# Patient Record
Sex: Female | Born: 1990 | Race: Asian | Hispanic: No | Marital: Married | Smoking: Never smoker
Health system: Southern US, Community
[De-identification: ages and names within clinical notes are randomized; demographics above are authoritative.]

## PROBLEM LIST (undated history)

## (undated) DIAGNOSIS — E039 Hypothyroidism, unspecified: Secondary | ICD-10-CM

## (undated) DIAGNOSIS — D649 Anemia, unspecified: Secondary | ICD-10-CM

## (undated) DIAGNOSIS — Z319 Encounter for procreative management, unspecified: Secondary | ICD-10-CM

## (undated) HISTORY — PX: OTHER SURGICAL HISTORY: SHX169

---

## 2019-03-04 DIAGNOSIS — Z0000 Encounter for general adult medical examination without abnormal findings: Secondary | ICD-10-CM | POA: Diagnosis not present

## 2019-10-05 DIAGNOSIS — Z6820 Body mass index (BMI) 20.0-20.9, adult: Secondary | ICD-10-CM | POA: Diagnosis not present

## 2019-10-05 DIAGNOSIS — Z124 Encounter for screening for malignant neoplasm of cervix: Secondary | ICD-10-CM | POA: Diagnosis not present

## 2019-10-05 DIAGNOSIS — Z01419 Encounter for gynecological examination (general) (routine) without abnormal findings: Secondary | ICD-10-CM | POA: Diagnosis not present

## 2020-04-18 HISTORY — PX: MOUTH SURGERY: SHX715

## 2020-08-06 DIAGNOSIS — U071 COVID-19: Secondary | ICD-10-CM | POA: Diagnosis not present

## 2020-12-21 DIAGNOSIS — Z319 Encounter for procreative management, unspecified: Secondary | ICD-10-CM | POA: Diagnosis not present

## 2020-12-21 DIAGNOSIS — E669 Obesity, unspecified: Secondary | ICD-10-CM | POA: Diagnosis not present

## 2020-12-21 DIAGNOSIS — Z1389 Encounter for screening for other disorder: Secondary | ICD-10-CM | POA: Diagnosis not present

## 2020-12-21 DIAGNOSIS — Z130 Encounter for screening for diseases of the blood and blood-forming organs and certain disorders involving the immune mechanism: Secondary | ICD-10-CM | POA: Diagnosis not present

## 2020-12-21 DIAGNOSIS — Z01419 Encounter for gynecological examination (general) (routine) without abnormal findings: Secondary | ICD-10-CM | POA: Diagnosis not present

## 2021-03-17 DIAGNOSIS — Z711 Person with feared health complaint in whom no diagnosis is made: Secondary | ICD-10-CM | POA: Diagnosis not present

## 2021-03-17 DIAGNOSIS — Z3A01 Less than 8 weeks gestation of pregnancy: Secondary | ICD-10-CM | POA: Diagnosis not present

## 2021-03-17 DIAGNOSIS — O209 Hemorrhage in early pregnancy, unspecified: Secondary | ICD-10-CM | POA: Diagnosis not present

## 2021-03-17 DIAGNOSIS — R102 Pelvic and perineal pain: Secondary | ICD-10-CM | POA: Diagnosis not present

## 2021-03-19 ENCOUNTER — Other Ambulatory Visit: Payer: Self-pay

## 2021-03-19 ENCOUNTER — Inpatient Hospital Stay (HOSPITAL_COMMUNITY)
Admission: AD | Admit: 2021-03-19 | Discharge: 2021-03-19 | Disposition: A | Discharge status: Home / Self Care | Payer: 59 | Attending: Obstetrics and Gynecology | Admitting: Obstetrics and Gynecology

## 2021-03-19 ENCOUNTER — Inpatient Hospital Stay (HOSPITAL_COMMUNITY): Payer: 59

## 2021-03-19 ENCOUNTER — Encounter (HOSPITAL_COMMUNITY): Payer: Self-pay | Admitting: Obstetrics and Gynecology

## 2021-03-19 DIAGNOSIS — O039 Complete or unspecified spontaneous abortion without complication: Secondary | ICD-10-CM | POA: Insufficient documentation

## 2021-03-19 DIAGNOSIS — O209 Hemorrhage in early pregnancy, unspecified: Secondary | ICD-10-CM

## 2021-03-19 DIAGNOSIS — Z3A01 Less than 8 weeks gestation of pregnancy: Secondary | ICD-10-CM | POA: Insufficient documentation

## 2021-03-19 DIAGNOSIS — Z3A00 Weeks of gestation of pregnancy not specified: Secondary | ICD-10-CM | POA: Diagnosis not present

## 2021-03-19 HISTORY — DX: Anemia, unspecified: D64.9

## 2021-03-19 LAB — HCG, QUANTITATIVE, PREGNANCY: hCG, Beta Chain, Quant, S: 480 m[IU]/mL — ABNORMAL HIGH (ref <5)

## 2021-03-19 LAB — WET PREP, GENITAL
Clue Cells Wet Prep HPF POC: NONE SEEN
Sperm: NONE SEEN
Trich, Wet Prep: NONE SEEN
WBC, Wet Prep HPF POC: >=10 — AB (ref <10)
Yeast Wet Prep HPF POC: NONE SEEN

## 2021-03-19 LAB — CBC
HCT: 40.3 % (ref 36.0–46.0)
Hemoglobin: 14.1 g/dL (ref 12.0–15.0)
MCH: 30.9 pg (ref 26.0–34.0)
MCHC: 35 g/dL (ref 30.0–36.0)
MCV: 88.4 fL (ref 80.0–100.0)
Platelets: 206 10*3/uL (ref 150–400)
RBC: 4.56 MIL/uL (ref 3.87–5.11)
RDW: 11.7 % (ref 11.5–15.5)
WBC: 6.9 10*3/uL (ref 4.0–10.5)
nRBC: 0 % (ref 0.0–0.2)

## 2021-03-19 LAB — ABO/RH: ABO/RH(D): B POS

## 2021-03-19 NOTE — MAU Note (Signed)
Renee Jacobs is a 31 y.o. at [redacted]w[redacted]d here in MAU reporting: started spotting on Saturday and then the bleeding got a little heavier. [facility] and [facility] 1880 (pt has records with her). Has continued to bleed. Changing a pad 3-4 times per day. No pain.   LMP: 01/28/21  Onset of complaint: ongoing  Pain score: 0/10  Vitals:   03/19/21 0954  BP: 121/84  Pulse: 94  Resp: 16  Temp: 98 F (36.7 C)  SpO2: 95%     Lab orders placed from triage: none

## 2021-03-19 NOTE — Discharge Instructions (Signed)
[facility]   [facility]             [address], [place], [place] [address] [phone]  [facility]                                                             [address], [address], [place], [place], [address] [phone]  [facility]                                    [address], [address], [place], [place], [address] [phone]  [facility] [place] [address], [address], [place], [place], [address] [phone]  [facility]                                 [address], [place], [place], [address] [phone]  [facility]                                    [address], [place], [place], [address] [phone]  [facility] [address], [address], [place], [place], [address]                              [facility] [address], [address], [place], [place], [address] [phone]  [facility]         Phone: [phone]  [facility]      Phone: [phone]   [facility]      Phone: [phone]  [facility]         Phone: [phone]   [facility]    Phone: [phone]  [facility]      Phone: [phone]  [facility]      Phone: [phone]  [facility]        Phone: [phone]  [facility]      Phone: [phone]  

## 2021-03-19 NOTE — MAU Provider Note (Signed)
History     CSN: [id number]  Arrival date and time: 03/19/21 0940   None    Chief Complaint  Patient presents with   Vaginal Bleeding   HPI Renee Jacobs is a 31 y.o. G1P0 at [redacted]w[redacted]d by LMP who presents to MAU for vaginal bleeding. Patient reports she started spotting on Saturday morning and by the afternoon it had become heavier. [facility] was evaluated [facility]. She had labs, but never had an ultrasound. HCG was 1880 per patient. Yesterday she reports that the bleeding was heavier and she started passing small blood clots. Today bleeding is not as heavy and has only had to change her pad once. She denies pain, fever, or dizziness. LMP 01/28/2021. Has a pregnancy confirmation appointment on Wednesday at [facility].    OB History     Gravida  1   Para      Term      Preterm      AB      Living         SAB      IAB      Ectopic      Multiple      Live Births              Past Medical History:  Diagnosis Date   Anemia     Past Surgical History:  Procedure Laterality Date   MOUTH SURGERY  04/2020    Family History  Problem Relation Age of Onset   Healthy Mother    Hyperlipidemia Father    Hypertension Father     Social History   Tobacco Use   Smoking status: Never   Smokeless tobacco: Never  Vaping Use   Vaping Use: Never used  Substance Use Topics   Alcohol use: Not Currently   Drug use: Never    Allergies: No Known Allergies  Medications Prior to Admission  Medication Sig Dispense Refill Last Dose   Prenatal Vit-Fe Fumarate-FA (PRENATAL VITAMIN PO) Take by mouth.   Past Week    Review of Systems  Constitutional: Negative.   Respiratory: Negative.    Cardiovascular: Negative.   Gastrointestinal: Negative.   Genitourinary:  Positive for vaginal bleeding.  Neurological: Negative.    Physical Exam   Blood pressure 121/84, pulse 94, temperature 98 F (36.7 C), temperature source Oral, resp. rate 16, height 5'  4" (1.626 m), weight 50.9 kg, last menstrual period 01/28/2021, SpO2 95 %.  Physical Exam Constitutional:      General: She is not in acute distress. Eyes:     Extraocular Movements: Extraocular movements intact.     Pupils: Pupils are equal, round, and reactive to light.  Cardiovascular:     Rate and Rhythm: Normal rate.  Pulmonary:     Effort: Pulmonary effort is normal.  Abdominal:     General: Abdomen is flat.     Palpations: Abdomen is soft.     Tenderness: There is no abdominal tenderness.  Genitourinary:    Comments: NEFG, vaginal walls pink with rugae, small amount of dark red blood, cervix appears visually closed without lesions/masses Musculoskeletal:        General: Normal range of motion.     Cervical back: Normal range of motion.  Skin:    General: Skin is warm and dry.  Neurological:     General: No focal deficit present.     Mental Status: She is alert and oriented to person, place, and time.  Psychiatric:  Mood and Affect: Mood normal.        Behavior: Behavior normal.        Thought Content: Thought content normal.        Judgment: Judgment normal.   US OB LESS THAN 14 WEEKS WITH OB TRANSVAGINAL  Result Date: 03/19/2021 CLINICAL DATA:  Vaginal bleeding EXAM: OBSTETRIC <14 WK ULTRASOUND TECHNIQUE: Transabdominal ultrasound was performed for evaluation of the gestation as well as the maternal uterus and adnexal regions. COMPARISON:  None. FINDINGS: Intrauterine gestational sac: None Yolk sac:  Not Visualized. Embryo:  Not Visualized. Cardiac Activity: Not Visualized. Heart Rate: Not visualized. Maternal uterus/adnexae: Endometrium 4. Trace, nonspecific free fluid in the low pelvis. IMPRESSION: No candidate intrauterine gestation identified by ultrasound. Early pregnancy of unknown location. Recommend ectopic precautions, serial beta HCG, and consider follow-up imaging in 7-14 days. Electronically Signed   By: Delanna Ahmadi M.D.   On: 03/19/2021 11:32    MAU  Course  Procedures CBC, HCG, ABO/RH Wet prep, GC/CT Ultrasound   MDM Labs reassuring. There was a significant drop in HCG, >50% from HCG on Saturday, high suspicion of miscarriage. Ultrasound without evidence of IUP. Bleeding minimal. I offered chaplain services to patient and her husband, who decline.  Assessment and Plan  Miscarriage Vaginal bleeding affecting pregnancy  - Discharge home in stable condition. - Keep appointment as scheduled on Wednesday 03/21/2021 - Strict return and bleeding precautions reviewed. Return to MAU sooner or as needed for worsening symptoms    Renee Harder, CNM 03/19/2021, 12:24 PM

## 2021-03-20 LAB — GC/CHLAMYDIA PROBE AMP (~~LOC~~) NOT AT ARMC
Chlamydia: NEGATIVE
Comment: NEGATIVE
Comment: NORMAL
Neisseria Gonorrhea: NEGATIVE

## 2021-03-29 DIAGNOSIS — O039 Complete or unspecified spontaneous abortion without complication: Secondary | ICD-10-CM | POA: Diagnosis not present

## 2021-04-12 DIAGNOSIS — O021 Missed abortion: Secondary | ICD-10-CM | POA: Diagnosis not present

## 2021-07-04 ENCOUNTER — Encounter: Payer: Self-pay | Admitting: Obstetrics and Gynecology

## 2021-07-12 ENCOUNTER — Ambulatory Visit (INDEPENDENT_AMBULATORY_CARE_PROVIDER_SITE_OTHER): Payer: 59 | Admitting: Obstetrics and Gynecology

## 2021-07-12 ENCOUNTER — Encounter: Payer: Self-pay | Admitting: Obstetrics and Gynecology

## 2021-07-12 VITALS — BP 125/84 | HR 73 | Ht 64.0 in | Wt 114.0 lb

## 2021-07-12 DIAGNOSIS — Z8759 Personal history of other complications of pregnancy, childbirth and the puerperium: Secondary | ICD-10-CM

## 2021-07-12 DIAGNOSIS — R7989 Other specified abnormal findings of blood chemistry: Secondary | ICD-10-CM

## 2021-07-12 NOTE — Progress Notes (Signed)
   GYNECOLOGY OFFICE VISIT NOTE  History:   Renee Jacobs is a 31 y.o. G1P0010 here today for follow up from miscarriage and to establish care. She was previously going to [facility] but wanted to switch elsewhere.   She had a SAB in January of this year. She got pregnant after 2 months of trying. She was about [redacted] weeks pregnant at the time of the loss. She had some TSH testing following that and one was normal and was abnormal. This was done through [facility]. We do not yet have those records.   In March she got her next period which was normal for her and then they started to TTC. She has positive OPKs. Her cycles are regular.    She denies any abnormal vaginal discharge, bleeding, pelvic pain or other concerns.     Past Medical History:  Diagnosis Date   Anemia     Past Surgical History:  Procedure Laterality Date   MOUTH SURGERY  04/2020    The following portions of the patient's history were reviewed and updated as appropriate: allergies, current medications, past family history, past medical history, past social history, past surgical history and problem list.   Health Maintenance:   Per pt - normal pap smear in November 2022. We will obtain records from [facility].   Review of Systems:  Pertinent items noted in HPI and remainder of comprehensive ROS otherwise negative.  Physical Exam:  BP 125/84   Pulse 73   Ht 5\' 4"  (1.626 m)   Wt 114 lb (51.7 kg)   LMP 06/16/2021   BMI 19.57 kg/m  CONSTITUTIONAL: Well-developed, well-nourished female in no acute distress.  HEENT:  Normocephalic, atraumatic. External right and left ear normal. No scleral icterus.  NECK: Normal range of motion, supple, no masses noted on observation SKIN: No rash noted. Not diaphoretic. No erythema. No pallor. MUSCULOSKELETAL: Normal range of motion. No edema noted. NEUROLOGIC: Alert and oriented to person, place, and time. Normal muscle tone coordination. No cranial nerve  deficit noted. PSYCHIATRIC: Normal mood and affect. Normal behavior. Normal judgment and thought content.  CARDIOVASCULAR: Normal heart rate noted RESPIRATORY: Effort and breath sounds normal, no problems with respiration noted   PELVIC: Deferred  Labs and Imaging No results found for this or any previous visit (from the past 168 hour(s)). No results found.  Assessment and Plan:   1. Abnormal TSH - Will obtain results from prior testing and send to endo. Reviewed would suggest testing through them as they would be best at establishing if she has the diagnosis of subclinical hypothyroidism and if she would need therapy.  - Ambulatory referral to Endocrinology  2. History of miscarriage - Discussed common causes of SAB. Reassured her this was nothing in her control.  - Discussed plan for subsequent pregnancy i.e. serials betas if bleeding and/or early 06/18/2021 (typically around 7-8wks) - Answered all questions   Routine preventative health maintenance measures emphasized. Please refer to After Visit Summary for other counseling recommendations.   Return in about 6 months (around 01/12/2022) for annual.  01/14/2022, MD, FACOG Obstetrician & Gynecologist, [facility], [facility]

## 2021-07-18 ENCOUNTER — Encounter: Payer: Self-pay | Admitting: Obstetrics and Gynecology

## 2021-07-25 ENCOUNTER — Encounter: Payer: Self-pay | Admitting: *Deleted

## 2021-08-07 ENCOUNTER — Encounter: Payer: Self-pay | Admitting: Obstetrics and Gynecology

## 2021-09-04 DIAGNOSIS — E559 Vitamin D deficiency, unspecified: Secondary | ICD-10-CM | POA: Diagnosis not present

## 2021-09-04 DIAGNOSIS — E538 Deficiency of other specified B group vitamins: Secondary | ICD-10-CM | POA: Diagnosis not present

## 2021-09-04 DIAGNOSIS — Z0000 Encounter for general adult medical examination without abnormal findings: Secondary | ICD-10-CM | POA: Diagnosis not present

## 2021-09-04 DIAGNOSIS — Z79899 Other long term (current) drug therapy: Secondary | ICD-10-CM | POA: Diagnosis not present

## 2021-09-11 DIAGNOSIS — Z124 Encounter for screening for malignant neoplasm of cervix: Secondary | ICD-10-CM | POA: Diagnosis not present

## 2021-09-11 DIAGNOSIS — Z131 Encounter for screening for diabetes mellitus: Secondary | ICD-10-CM | POA: Diagnosis not present

## 2021-09-11 DIAGNOSIS — Z13220 Encounter for screening for lipoid disorders: Secondary | ICD-10-CM | POA: Diagnosis not present

## 2021-09-11 DIAGNOSIS — E559 Vitamin D deficiency, unspecified: Secondary | ICD-10-CM | POA: Diagnosis not present

## 2021-11-09 ENCOUNTER — Encounter: Payer: Self-pay | Admitting: Obstetrics and Gynecology

## 2021-11-13 ENCOUNTER — Other Ambulatory Visit: Payer: Self-pay | Admitting: *Deleted

## 2022-01-01 ENCOUNTER — Encounter: Payer: Self-pay | Admitting: *Deleted

## 2022-01-01 NOTE — Progress Notes (Signed)
   ANNUAL EXAM Patient name: Treva Nase MRN [id number]  Date of birth: [birth year] Chief Complaint:   No chief complaint on file.  History of Present Illness:   Rozelia Schlosser is a 31 y.o. G49P0010 female being seen today for a routine annual exam.   Current complaints: None  Patient's last menstrual period was 12/02/2021 (approximate).  Current birth control: None, plans to TTC. Seeing [facility] next week.   Last pap: 2021 per documentation in records under media tab.  Results were: NILM w/ HRHPV negative. H/O abnormal pap: no  Health Maintenance Due  Topic Date Due   COVID-19 Vaccine (1) Never done   HIV Screening  Never done   Hepatitis C Screening  Never done   TETANUS/TDAP  Never done   INFLUENZA VACCINE  Never done    Review of Systems:   Pertinent items are noted in HPI Denies any headaches, blurred vision, fatigue, shortness of breath, chest pain, abdominal pain, abnormal vaginal discharge/itching/odor/irritation, problems with periods, bowel movements, urination, or intercourse unless otherwise stated above.  Pertinent History Reviewed:  Reviewed past medical,surgical, social and family history.  Reviewed problem list, medications and allergies. Physical Assessment:   Vitals:   01/02/22 0813  BP: 111/74  Pulse: 68  Weight: 117 lb (53.1 kg)  Height: 5\' 4"  (1.626 m)  Body mass index is 20.08 kg/m.   Physical Examination:  General appearance - well appearing, and in no distress Mental status - alert, oriented to person, place, and time Psych:  She has a normal mood and affect Skin - warm and dry, normal color, no suspicious lesions noted Chest - effort normal Heart - normal rate  Breasts - breasts appear normal, no suspicious masses, no skin or nipple changes or axillary nodes Abdomen - soft, nontender, nondistended, no masses or organomegaly Pelvic -  VULVA: normal appearing vulva with no masses, tenderness or lesions  VAGINA: normal appearing vagina with normal  color and discharge, no lesions  CERVIX: normal appearing cervix without discharge or lesions, no CMT UTERUS: uterus is felt to be normal size, shape, consistency and nontender  ADNEXA: No adnexal masses or tenderness noted. Extremities:  No swelling or varicosities noted  Chaperone present for exam  No results found for this or any previous visit (from the past 24 hour(s)).  Assessment & Plan:  Diagnoses and all orders for this visit:  Encounter for annual routine gynecological examination  - Cervical cancer screening: Discussed guidelines. Pap with HPV wnl 09/2019. Due no sooner than 2024.  - GC/CT: declines - Birth Control:  Declines, plans to TTC - Breast Health: Encouraged self breast awareness/SBE. Teaching provided.  - F/U 12 months and prn   No orders of the defined types were placed in this encounter.   Meds: No orders of the defined types were placed in this encounter.   Follow-up: No follow-ups on file.  [name], MD 01/02/2022 9:06 AM

## 2022-01-02 ENCOUNTER — Ambulatory Visit: Payer: 59 | Admitting: Obstetrics and Gynecology

## 2022-01-02 ENCOUNTER — Encounter: Payer: Self-pay | Admitting: Obstetrics and Gynecology

## 2022-01-02 ENCOUNTER — Ambulatory Visit (INDEPENDENT_AMBULATORY_CARE_PROVIDER_SITE_OTHER): Payer: 59 | Admitting: Obstetrics and Gynecology

## 2022-01-02 VITALS — BP 111/74 | HR 68 | Ht 64.0 in | Wt 117.0 lb

## 2022-01-02 DIAGNOSIS — Z01419 Encounter for gynecological examination (general) (routine) without abnormal findings: Secondary | ICD-10-CM | POA: Diagnosis not present

## 2022-01-08 DIAGNOSIS — Z3169 Encounter for other general counseling and advice on procreation: Secondary | ICD-10-CM | POA: Diagnosis not present

## 2022-01-08 DIAGNOSIS — Z1321 Encounter for screening for nutritional disorder: Secondary | ICD-10-CM | POA: Diagnosis not present

## 2022-01-08 DIAGNOSIS — Z3141 Encounter for fertility testing: Secondary | ICD-10-CM | POA: Diagnosis not present

## 2022-01-08 DIAGNOSIS — Z1371 Encounter for nonprocreative screening for genetic disease carrier status: Secondary | ICD-10-CM | POA: Diagnosis not present

## 2022-01-08 DIAGNOSIS — Z0184 Encounter for antibody response examination: Secondary | ICD-10-CM | POA: Diagnosis not present

## 2022-02-08 DIAGNOSIS — Z1371 Encounter for nonprocreative screening for genetic disease carrier status: Secondary | ICD-10-CM | POA: Diagnosis not present

## 2022-02-08 DIAGNOSIS — Z3169 Encounter for other general counseling and advice on procreation: Secondary | ICD-10-CM | POA: Diagnosis not present

## 2022-02-08 DIAGNOSIS — Z3141 Encounter for fertility testing: Secondary | ICD-10-CM | POA: Diagnosis not present

## 2022-02-08 DIAGNOSIS — Z0184 Encounter for antibody response examination: Secondary | ICD-10-CM | POA: Diagnosis not present

## 2022-02-08 DIAGNOSIS — Z1321 Encounter for screening for nutritional disorder: Secondary | ICD-10-CM | POA: Diagnosis not present

## 2022-02-09 DIAGNOSIS — Z3169 Encounter for other general counseling and advice on procreation: Secondary | ICD-10-CM | POA: Diagnosis not present

## 2022-02-09 DIAGNOSIS — Z31430 Encounter of female for testing for genetic disease carrier status for procreative management: Secondary | ICD-10-CM | POA: Diagnosis not present

## 2022-02-09 DIAGNOSIS — Z1371 Encounter for nonprocreative screening for genetic disease carrier status: Secondary | ICD-10-CM | POA: Diagnosis not present

## 2022-03-01 ENCOUNTER — Ambulatory Visit: Payer: 59 | Admitting: Internal Medicine

## 2022-03-08 DIAGNOSIS — Z0184 Encounter for antibody response examination: Secondary | ICD-10-CM | POA: Diagnosis not present

## 2022-03-08 DIAGNOSIS — Z3141 Encounter for fertility testing: Secondary | ICD-10-CM | POA: Diagnosis not present

## 2022-03-08 DIAGNOSIS — R7989 Other specified abnormal findings of blood chemistry: Secondary | ICD-10-CM | POA: Diagnosis not present

## 2022-04-15 DIAGNOSIS — E288 Other ovarian dysfunction: Secondary | ICD-10-CM | POA: Diagnosis not present

## 2022-05-15 DIAGNOSIS — Z113 Encounter for screening for infections with a predominantly sexual mode of transmission: Secondary | ICD-10-CM | POA: Diagnosis not present

## 2022-05-15 DIAGNOSIS — Z3141 Encounter for fertility testing: Secondary | ICD-10-CM | POA: Diagnosis not present

## 2022-06-13 DIAGNOSIS — Z3141 Encounter for fertility testing: Secondary | ICD-10-CM | POA: Diagnosis not present

## 2022-09-02 ENCOUNTER — Other Ambulatory Visit: Payer: Self-pay

## 2022-09-02 DIAGNOSIS — E039 Hypothyroidism, unspecified: Secondary | ICD-10-CM

## 2022-09-26 ENCOUNTER — Encounter: Payer: Self-pay | Admitting: "Endocrinology

## 2022-09-26 ENCOUNTER — Other Ambulatory Visit (INDEPENDENT_AMBULATORY_CARE_PROVIDER_SITE_OTHER): Payer: BC Managed Care – PPO

## 2022-09-26 DIAGNOSIS — E039 Hypothyroidism, unspecified: Secondary | ICD-10-CM | POA: Diagnosis not present

## 2022-09-26 LAB — T4, FREE: Free T4: 0.9 ng/dL (ref 0.60–1.60)

## 2022-09-26 LAB — TSH: TSH: 3.84 u[IU]/mL (ref 0.35–5.50)

## 2022-09-27 ENCOUNTER — Other Ambulatory Visit: Payer: Self-pay | Admitting: "Endocrinology

## 2022-09-27 MED ORDER — LEVOTHYROXINE SODIUM 50 MCG PO TABS: 50.0000 ug | ORAL_TABLET | Freq: Every day | ORAL | 0 refills | Status: DC

## 2022-10-10 ENCOUNTER — Ambulatory Visit (INDEPENDENT_AMBULATORY_CARE_PROVIDER_SITE_OTHER): Payer: BC Managed Care – PPO | Admitting: "Endocrinology

## 2022-10-10 ENCOUNTER — Encounter: Payer: Self-pay | Admitting: "Endocrinology

## 2022-10-10 VITALS — BP 100/80 | HR 73 | Ht 64.0 in | Wt 119.8 lb

## 2022-10-10 DIAGNOSIS — E038 Other specified hypothyroidism: Secondary | ICD-10-CM | POA: Diagnosis not present

## 2022-10-10 NOTE — Progress Notes (Signed)
Outpatient Endocrinology Note Renee Allegan, MD  10/10/22   Renee Jacobs 05-18-1990 [id number]  Referring Provider: Fermin Schwab, MD Primary Care Provider: Pcp, No Subjective  No chief complaint on file.   Assessment & Plan  Diagnoses and all orders for this visit:  Subclinical hypothyroidism -     TSH; Future -     T4, free; Future    Renee Jacobs is currently taking levothyroxine 50 mcg every day since 09/27/22 started by me to help assist with pregnancy. Patient is undergoing fertility treatment.  Patient is currently biochemically euthyroid, with levothyroxine.  Highest TSH off of levothyroxine per pt was in 6.   Educated on thyroid axis.  Recommend the following: Take levothyroxine 50 mcg every morning.  Repeat labs today. Advised to take levothyroxine first thing in the morning on empty stomach and wait at least 30 minutes to 1 hour before eating or drinking anything or taking any other medications. Space out levothyroxine by 4 hours from any acid reflux medication/fibrate/iron/calcium/multivitamin. Advised to take birth control pills and nutritional supplements in the evening. Repeat lab before next visit or sooner if symptoms of hyperthyroidism or hypothyroidism develop.  Notify us immediately in case of pregnancy/breastfeeding or significant weight gain or loss. Counseled on compliance and follow up needs.  I have reviewed current medications, nurse's notes, allergies, vital signs, past medical and surgical history, family medical history, and social history for this encounter. Counseled patient on symptoms, examination findings, lab findings, imaging results, treatment decisions and monitoring and prognosis. The patient understood the recommendations and agrees with the treatment plan. All questions regarding treatment plan were fully answered.   Return in about 6 weeks (around 11/21/2022) for telemedicine at 3:20 pm, labs before next visit, labs today  .   Renee [place], MD  10/10/22   I have reviewed current medications, nurse's notes, allergies, vital signs, past medical and surgical history, family medical history, and social history for this encounter. Counseled patient on symptoms, examination findings, lab findings, imaging results, treatment decisions and monitoring and prognosis. The patient understood the recommendations and agrees with the treatment plan. All questions regarding treatment plan were fully answered.   History of Present Illness Renee Jacobs is a 32 y.o. year old female who presents to our clinic with subclinical hypothyroidism diagnosed in 2023.    Highest TSH off of levothyroxine per pt was in 6.   Started attempts on conceiving in Nov 2022 Has a miscarriage at 6 weeks in 02/2021 On fertility treatment in 01/2022 with [facility] in [place]  She is on IVF treatment since 05/2022 Currently pregnant per pt   Symptoms suggestive of HYPOTHYROIDISM:  fatigue Yes, improved  weight gain No cold intolerance  No constipation  No  Compressive symptoms:  dysphagia  No dysphonia  No positional dyspnea (especially with simultaneous arms elevation)  No  Smokes  No On biotin  No Personal history of head/neck surgery/irradiation  No  Physical Exam  BP 100/80   Pulse 73   Ht 5\' 4"  (1.626 m)   Wt 119 lb 12.8 oz (54.3 kg)   SpO2 99%   BMI 20.56 kg/m  Constitutional: well developed, well nourished Head: normocephalic, atraumatic, no exophthalmos Eyes: sclera anicteric, no redness Neck: no thyromegaly, no thyroid tenderness; no nodules palpated Lungs: normal respiratory effort Neurology: alert and oriented, no fine hand tremor Skin: dry, no appreciable rashes Musculoskeletal: no appreciable defects Psychiatric: normal mood and affect  Allergies No Known Allergies  Current Medications Patient's  Medications  New Prescriptions   No medications on file  Previous Medications   ASPIRIN 81  MG CHEWABLE TABLET    Chew 81 mg by mouth daily.   ESTRADIOL (ESTRACE) 2 MG TABLET    Take 2 mg by mouth daily.   LEVOTHYROXINE (SYNTHROID) 50 MCG TABLET    Take 1 tablet (50 mcg total) by mouth daily.   OMEGA-3 FATTY ACIDS (FISH OIL) 1000 MG CAPS    Take 1,000 mg by mouth daily.   PRENATAL VIT-FE FUMARATE-FA (PRENATAL VITAMIN PO)    Take by mouth.   PROGESTERONE MICRONIZED PO    Take 1 mL by mouth daily at 8 pm.  Modified Medications   No medications on file  Discontinued Medications   No medications on file    Past Medical History Past Medical History:  Diagnosis Date   Anemia     Past Surgical History Past Surgical History:  Procedure Laterality Date   MOUTH SURGERY  04/2020    Family History family history includes Healthy in her mother; Hyperlipidemia in her father; Hypertension in her father.  Social History Social History   Socioeconomic History   Marital status: Married    Spouse name: Not on file   Number of children: Not on file   Years of education: Not on file   Highest education level: Not on file  Occupational History   Not on file  Tobacco Use   Smoking status: Never   Smokeless tobacco: Never  Vaping Use   Vaping status: Never Used  Substance and Sexual Activity   Alcohol use: Not Currently   Drug use: Never   Sexual activity: Yes  Other Topics Concern   Not on file  Social History Narrative   Not on file   Social Determinants of Health   Financial Resource Strain: Not on file  Food Insecurity: Not on file  Transportation Needs: Not on file  Physical Activity: Not on file  Stress: Not on file  Social Connections: Not on file  Intimate Partner Violence: Not on file    Laboratory Investigations Lab Results  Component Value Date   TSH 3.84 09/26/2022   FREET4 0.90 09/26/2022     No results found for: "TSI"   No components found for: "TRAB"   No results found for: "CHOL" No results found for: "HDL" No results found for:  "LDLCALC" No results found for: "TRIG" No results found for: "CHOLHDL" No results found for: "CREATININE" No results found for: "GFR" No results found for: "NA", "K", "CL", "CO2", "GLUCOSE", "BUN", "CREATININE", "CALCIUM", "PROT", "ALBUMIN", "AST", "ALT", "ALKPHOS", "BILITOT", "GFRNONAA", "GFRAA"     No data to display             Component Value Date/Time   WBC 6.9 03/19/2021 1056   RBC 4.56 03/19/2021 1056   HGB 14.1 03/19/2021 1056   HCT 40.3 03/19/2021 1056   PLT 206 03/19/2021 1056   MCV 88.4 03/19/2021 1056   MCH 30.9 03/19/2021 1056   MCHC 35.0 03/19/2021 1056   RDW 11.7 03/19/2021 1056      Parts of this note may have been dictated using voice recognition software. There may be variances in spelling and vocabulary which are unintentional. Not all errors are proofread. Please notify the There[facility] Parkin if any discrepancies are noted or if the meaning of any statement is not clear.

## 2022-10-11 LAB — T4, FREE: Free T4: 1.26 ng/dL (ref 0.60–1.60)

## 2022-10-11 LAB — TSH: TSH: 2.58 u[IU]/mL (ref 0.35–5.50)

## 2022-10-14 ENCOUNTER — Telehealth: Payer: Self-pay | Admitting: "Endocrinology

## 2022-10-14 ENCOUNTER — Other Ambulatory Visit: Payer: Self-pay | Admitting: "Endocrinology

## 2022-10-14 DIAGNOSIS — E038 Other specified hypothyroidism: Secondary | ICD-10-CM

## 2022-10-14 NOTE — Telephone Encounter (Signed)
Patient asking to have lab orders sent to [facility] prior to her next visit. Sept 22 nd, please let patient know when done

## 2022-10-16 NOTE — Telephone Encounter (Signed)
Order has been faxed to [facility] [phone] (fax)

## 2022-10-24 ENCOUNTER — Other Ambulatory Visit: Payer: Self-pay | Admitting: "Endocrinology

## 2022-11-08 ENCOUNTER — Encounter: Payer: Self-pay | Admitting: *Deleted

## 2022-11-12 ENCOUNTER — Other Ambulatory Visit: Payer: Self-pay | Admitting: "Endocrinology

## 2022-11-12 DIAGNOSIS — E038 Other specified hypothyroidism: Secondary | ICD-10-CM | POA: Diagnosis not present

## 2022-11-13 LAB — T4F: T4,Free (Direct): 1.97 ng/dL — ABNORMAL HIGH (ref 0.82–1.77)

## 2022-11-13 LAB — TSH RFX ON ABNORMAL TO FREE T4: TSH: 0.261 u[IU]/mL — ABNORMAL LOW (ref 0.450–4.500)

## 2022-11-21 ENCOUNTER — Telehealth (INDEPENDENT_AMBULATORY_CARE_PROVIDER_SITE_OTHER): Payer: BC Managed Care – PPO | Admitting: "Endocrinology

## 2022-11-21 ENCOUNTER — Encounter: Payer: Self-pay | Admitting: "Endocrinology

## 2022-11-21 DIAGNOSIS — E038 Other specified hypothyroidism: Secondary | ICD-10-CM

## 2022-11-21 DIAGNOSIS — Z3A09 9 weeks gestation of pregnancy: Secondary | ICD-10-CM | POA: Diagnosis not present

## 2022-11-21 DIAGNOSIS — O210 Mild hyperemesis gravidarum: Secondary | ICD-10-CM | POA: Diagnosis not present

## 2022-11-21 NOTE — Progress Notes (Addendum)
The patient reports they are currently: Renee Jacobs. I spent 14-15 minutes on the video with the patient on the date of service. I spent an additional 10 minutes on pre- and post-visit activities on the date of service.   The patient was physically located in [place] or a state in which I am permitted to provide care. The patient and/or parent/guardian understood that s/he may incur co-pays and cost sharing, and agreed to the telemedicine visit. The visit was reasonable and appropriate under the circumstances given the patient's presentation at the time.  The patient and/or parent/guardian has been advised of the potential risks and limitations of this mode of treatment (including, but not limited to, the absence of in-person examination) and has agreed to be treated using telemedicine. The patient's/patient's family's questions regarding telemedicine have been answered.   The patient and/or parent/guardian has also been advised to contact their provider's office for worsening conditions, and seek emergency medical treatment and/or call 911 if the patient deems either necessary.     Outpatient Endocrinology Note Altamese Harpster, MD  11/21/22   Renee Jacobs 1990/08/13 [id number]  Referring Provider: No ref. provider found Primary Care Provider: Pcp, No Subjective  No chief complaint on file.   Assessment & Plan  Diagnoses and all orders for this visit:  Subclinical hypothyroidism -     T4, free; Future -     TSH; Future  Morning sickness   Noraa Pickeral is currently taking levothyroxine 50 mcg every day (2 pills on Sunday). Start on 09/27/22 started by me to help assist with ongoing IVF induced pregnancy.   Patient is currently biochemically euthyroid, with levothyroxine. Highest TSH off of levothyroxine per pt was in 6.   Educated on thyroid axis.  Recommend the following: Take one pill of levothyroxine 50 mcg every morning (stop extra pill on Sunday).  Repeat labs before next visit.   Advised to take levothyroxine first thing in the morning on empty stomach and wait at least 30 minutes to 1 hour before eating or drinking anything or taking any other medications. Space out levothyroxine by 4 hours from any acid reflux medication/fibrate/iron/calcium/multivitamin. Advised to take birth control pills and nutritional supplements in the evening. Repeat lab before next visit or sooner if symptoms of hyperthyroidism or hypothyroidism develop.  Notify us immediately in case of pregnancy/breastfeeding or significant weight gain or loss. Counseled on compliance and follow up needs.  Discussed extensively ways to cope up with morning sickness, improve oral intake and healthy weight gain during pregnancy   I have reviewed current medications, nurse's notes, allergies, vital signs, past medical and surgical history, family medical history, and social history for this encounter. Counseled patient on symptoms, examination findings, lab findings, imaging results, treatment decisions and monitoring and prognosis. The patient understood the recommendations and agrees with the treatment plan. All questions regarding treatment plan were fully answered.   Return in about 6 weeks (around 01/02/2023) for tele-visit: 3:20 pm, lab before next visit.   Altamese Silverdale, MD  11/21/22   I have reviewed current medications, nurse's notes, allergies, vital signs, past medical and surgical history, family medical history, and social history for this encounter. Counseled patient on symptoms, examination findings, lab findings, imaging results, treatment decisions and monitoring and prognosis. The patient understood the recommendations and agrees with the treatment plan. All questions regarding treatment plan were fully answered.   History of Present Illness Renee Jacobs is a 32 y.o. year old female who presents to our clinic with subclinical  hypothyroidism diagnosed in 2023.   Patient is 9 weeks 5 days  pregnant. Lost 4 lbs due to morning sickness.   Highest TSH off of levothyroxine per pt was in 6.   Started attempts on conceiving in Nov 2022 Has a miscarriage at 6 weeks in 02/2021 On fertility treatment in 01/2022 with [facility] in [place]  She is on IVF treatment since 05/2022 Currently pregnant per pt   Symptoms suggestive of HYPOTHYROIDISM:  fatigue Yes, improved  weight gain No cold intolerance  No constipation  No  Compressive symptoms:  dysphagia  No dysphonia  No positional dyspnea (especially with simultaneous arms elevation)  No  Smokes  No On biotin  No Personal history of head/neck surgery/irradiation  No  Physical Exam  There were no vitals taken for this visit. Constitutional: well developed, well nourished Head: normocephalic, atraumatic, no exophthalmos Eyes: sclera anicteric, no redness Neck: no thyromegaly, no thyroid tenderness; no nodules palpated Lungs: normal respiratory effort Neurology: alert and oriented, no fine hand tremor Skin: dry, no appreciable rashes Musculoskeletal: no appreciable defects Psychiatric: normal mood and affect  Allergies No Known Allergies  Current Medications Patient's Medications  New Prescriptions   No medications on file  Previous Medications   ASPIRIN 81 MG CHEWABLE TABLET    Chew 81 mg by mouth daily.   ESTRADIOL (ESTRACE) 2 MG TABLET    Take 2 mg by mouth daily.   LEVOTHYROXINE (SYNTHROID) 50 MCG TABLET    TAKE 1 TABLET BY MOUTH EVERY DAY   OMEGA-3 FATTY ACIDS (FISH OIL) 1000 MG CAPS    Take 1,000 mg by mouth daily.   PRENATAL VIT-FE FUMARATE-FA (PRENATAL VITAMIN PO)    Take by mouth.   PROGESTERONE MICRONIZED PO    Take 1 mL by mouth daily at 8 pm.  Modified Medications   No medications on file  Discontinued Medications   No medications on file    Past Medical History Past Medical History:  Diagnosis Date   Anemia     Past Surgical History Past Surgical History:  Procedure  Laterality Date   MOUTH SURGERY  04/2020    Family History family history includes Healthy in her mother; Hyperlipidemia in her father; Hypertension in her father.  Social History Social History   Socioeconomic History   Marital status: Married    Spouse name: Not on file   Number of children: Not on file   Years of education: Not on file   Highest education level: Not on file  Occupational History   Not on file  Tobacco Use   Smoking status: Never   Smokeless tobacco: Never  Vaping Use   Vaping status: Never Used  Substance and Sexual Activity   Alcohol use: Not Currently   Drug use: Never   Sexual activity: Yes  Other Topics Concern   Not on file  Social History Narrative   Not on file   Social Determinants of Health   Financial Resource Strain: Not on file  Food Insecurity: Not on file  Transportation Needs: Not on file  Physical Activity: Not on file  Stress: Not on file  Social Connections: Not on file  Intimate Partner Violence: Not on file    Laboratory Investigations Lab Results  Component Value Date   TSH 0.261 (L) 11/12/2022   TSH 2.58 10/10/2022   TSH 3.84 09/26/2022   FREET4 1.26 10/10/2022   FREET4 0.90 09/26/2022     No results found for: "TSI"   No components found  for: "TRAB"   No results found for: "CHOL" No results found for: "HDL" No results found for: "LDLCALC" No results found for: "TRIG" No results found for: "CHOLHDL" No results found for: "CREATININE" No results found for: "GFR" No results found for: "NA", "K", "CL", "CO2", "GLUCOSE", "BUN", "CREATININE", "CALCIUM", "PROT", "ALBUMIN", "AST", "ALT", "ALKPHOS", "BILITOT", "GFRNONAA", "GFRAA"     No data to display             Component Value Date/Time   WBC 6.9 03/19/2021 1056   RBC 4.56 03/19/2021 1056   HGB 14.1 03/19/2021 1056   HCT 40.3 03/19/2021 1056   PLT 206 03/19/2021 1056   MCV 88.4 03/19/2021 1056   MCH 30.9 03/19/2021 1056   MCHC 35.0 03/19/2021 1056   RDW  11.7 03/19/2021 1056      Parts of this note may have been dictated using voice recognition software. There may be variances in spelling and vocabulary which are unintentional. Not all errors are proofread. Please notify the Thereasa Parkin if any discrepancies are noted or if the meaning of any statement is not clear.

## 2022-12-05 ENCOUNTER — Encounter: Payer: Self-pay | Admitting: "Endocrinology

## 2022-12-10 ENCOUNTER — Encounter: Payer: Self-pay | Admitting: Obstetrics and Gynecology

## 2022-12-10 ENCOUNTER — Encounter: Payer: BC Managed Care – PPO | Admitting: Obstetrics and Gynecology

## 2022-12-10 DIAGNOSIS — Z3490 Encounter for supervision of normal pregnancy, unspecified, unspecified trimester: Secondary | ICD-10-CM | POA: Insufficient documentation

## 2022-12-11 ENCOUNTER — Encounter: Payer: Self-pay | Admitting: Obstetrics and Gynecology

## 2022-12-11 ENCOUNTER — Ambulatory Visit (INDEPENDENT_AMBULATORY_CARE_PROVIDER_SITE_OTHER): Payer: BC Managed Care – PPO | Admitting: Obstetrics and Gynecology

## 2022-12-11 VITALS — BP 126/88 | HR 98 | Wt 109.0 lb

## 2022-12-11 DIAGNOSIS — O09819 Supervision of pregnancy resulting from assisted reproductive technology, unspecified trimester: Secondary | ICD-10-CM | POA: Insufficient documentation

## 2022-12-11 DIAGNOSIS — Z3A12 12 weeks gestation of pregnancy: Secondary | ICD-10-CM | POA: Diagnosis not present

## 2022-12-11 DIAGNOSIS — O219 Vomiting of pregnancy, unspecified: Secondary | ICD-10-CM | POA: Diagnosis not present

## 2022-12-11 DIAGNOSIS — Z1339 Encounter for screening examination for other mental health and behavioral disorders: Secondary | ICD-10-CM

## 2022-12-11 DIAGNOSIS — E039 Hypothyroidism, unspecified: Secondary | ICD-10-CM

## 2022-12-11 DIAGNOSIS — Z3490 Encounter for supervision of normal pregnancy, unspecified, unspecified trimester: Secondary | ICD-10-CM | POA: Diagnosis not present

## 2022-12-11 DIAGNOSIS — O09811 Supervision of pregnancy resulting from assisted reproductive technology, first trimester: Secondary | ICD-10-CM

## 2022-12-11 MED ORDER — FAMOTIDINE 20 MG PO TABS
20.0000 mg | ORAL_TABLET | Freq: Two times a day (BID) | ORAL | 1 refills | Status: DC
Start: 2022-12-11 — End: 2023-03-02

## 2022-12-11 MED ORDER — METOCLOPRAMIDE HCL 10 MG PO TABS: 10.0000 mg | ORAL_TABLET | Freq: Four times a day (QID) | ORAL | 2 refills | Status: DC | PRN

## 2022-12-11 MED ORDER — ONDANSETRON 4 MG PO TBDP: 4.0000 mg | ORAL_TABLET | Freq: Four times a day (QID) | ORAL | 0 refills | Status: DC | PRN

## 2022-12-11 MED ORDER — ONDANSETRON 4 MG PO TBDP: 4.0000 mg | ORAL_TABLET | Freq: Four times a day (QID) | ORAL | 3 refills | Status: DC | PRN

## 2022-12-11 NOTE — Progress Notes (Signed)
History:   Renee Jacobs is a 32 y.o. G2P0010 at [redacted]w[redacted]d by date of embryo transfer being seen today for her first obstetrical visit.   Patient does intend to breast feed.   Pregnancy history fully reviewed. Obstetrical history is significant for IVF preg  Patient reports heartburn and nausea.      HISTORY: OB History  Gravida Para Term Preterm AB Living  2 0 0 0 1 0  SAB IAB Ectopic Multiple Live Births  1 0 0 0 0    # Outcome Date GA Lbr Len/2nd Weight Sex Type Anes PTL Lv  2 Current           1 SAB 03/19/21 [redacted]w[redacted]d            Last pap smear was done 1 yr ago - was normal through PCP.   Past Medical History:  Diagnosis Date   Anemia    Past Surgical History:  Procedure Laterality Date   MOUTH SURGERY  04/2020   Family History  Problem Relation Age of Onset   Healthy Mother    Hyperlipidemia Father    Hypertension Father    Social History   Tobacco Use   Smoking status: Never   Smokeless tobacco: Never  Vaping Use   Vaping status: Never Used  Substance Use Topics   Alcohol use: Not Currently   Drug use: Never   No Known Allergies Current Outpatient Medications on File Prior to Visit  Medication Sig Dispense Refill   aspirin 81 MG chewable tablet Chew 81 mg by mouth daily.     levothyroxine (SYNTHROID) 50 MCG tablet TAKE 1 TABLET BY MOUTH EVERY DAY 30 tablet 3   Omega-3 Fatty Acids (FISH OIL) 1000 MG CAPS Take 1,000 mg by mouth daily.     Prenatal Vit-Fe Fumarate-FA (PRENATAL VITAMIN PO) Take by mouth.     No current facility-administered medications on file prior to visit.    Review of Systems Pertinent items noted in HPI and remainder of comprehensive ROS otherwise negative.  Physical Exam:   Vitals:   12/11/22 0852  BP: 126/88  Pulse: 98  Weight: 109 lb (49.4 kg)   Fetal Heart Rate (bpm): 152  General: well-developed, well-nourished female in no acute distress  Breasts:  normal appearance, no masses or tenderness bilaterally  Skin: normal  coloration and turgor, no rashes  Neurologic: oriented, normal, negative, normal mood  Extremities: normal strength, tone, and muscle mass, ROM of all joints is normal  HEENT PERRLA, extraocular movement intact and sclera clear, anicteric  Neck supple and no masses  Cardiovascular: regular rate and rhythm  Respiratory:  no respiratory distress, normal breath sounds  Abdomen: soft, non-tender; bowel sounds normal; no masses,  no organomegaly  Pelvic: Not examined    Assessment:    Pregnancy: G2P0010 Patient Active Problem List   Diagnosis Date Noted   Pregnancy resulting from in vitro fertilization 12/11/2022   Hypothyroidism 12/11/2022   Supervision of normal pregnancy 12/10/2022     Plan:    1. Encounter for supervision of normal pregnancy, antepartum, unspecified gravidity Initial labs ordered. Continue prenatal vitamins. Problem list reviewed and updated. Genetic Screening discussed, NIPS: ordered. Ultrasound discussed; fetal anatomic survey: ordered. Anticipatory guidance about prenatal visits given including labs, ultrasounds, and testing. Discussed usage of Babyscripts and virtual visits - Pregnancy, Initial Screen - [place] MFM OB DETAIL +14 WK; Future - TSH Rfx on Abnormal to Free T4 - HgB A1c  2. Pregnancy resulting from in vitro fertilization  in first trimester Continue ldASA until delivery  3. Pregnancy with 12 completed weeks gestation  4. Hypothyroidism, unspecified type Check with PNL - has been about 1 month since last draw. Reviewed would check monthly until stable synthroid dose and then check Qtrimester.   5. Nausea and vomiting during pregnancy Will try Reglan and Zofran ODT. Reviewed risks of Zofran are shown to be correlation not causation and studies are not well done. She is now beyond organogenesis. Would recommend Reglan as next time (also taking Unisom/B6) and if these don't work or can't keep them down then take the Zofran as needed. Discussed reflux  might be from the N/V so may not need anything for it but will send pepcid in the event it doesn't improve after N/V improves and if tums don't work.  - ondansetron (ZOFRAN-ODT) 4 MG disintegrating tablet; Take 1 tablet (4 mg total) by mouth every 6 (six) hours as needed for nausea.  Dispense: 20 tablet; Refill: 0 - metoCLOPramide (REGLAN) 10 MG tablet; Take 1 tablet (10 mg total) by mouth 4 (four) times daily as needed for nausea or vomiting.  Dispense: 30 tablet; Refill: 2 - famotidine (PEPCID) 20 MG tablet; Take 1 tablet (20 mg total) by mouth 2 (two) times daily.  Dispense: 60 tablet; Refill: 1   The nature of [place] [facility] with multiple MDs and Advanced Practice Providers was explained to patient; also emphasized that residents, students are part of our team. Routine obstetric precautions reviewed. Encouraged to seek out care at office or emergency room [facility] preferred) for urgent and/or emergent concerns. Return in about 4 weeks (around 01/08/2023) for OB VISIT, MD or APP.    [name], MD, FACOG Obstetrician & Gynecologist, [facility] for [facility], [facility]

## 2022-12-13 ENCOUNTER — Encounter: Payer: Self-pay | Admitting: Obstetrics and Gynecology

## 2022-12-15 LAB — HCV INTERPRETATION

## 2022-12-15 LAB — PREGNANCY, INITIAL SCREEN
Antibody Screen: NEGATIVE
Basophils Absolute: 0 10*3/uL (ref 0.0–0.2)
Basos: 1 %
Bilirubin, UA: NEGATIVE
Chlamydia trachomatis, NAA: NEGATIVE
EOS (ABSOLUTE): 0 10*3/uL (ref 0.0–0.4)
Eos: 0 %
Glucose, UA: NEGATIVE
HCV Ab: NONREACTIVE
HIV Screen 4th Generation wRfx: NONREACTIVE
Hematocrit: 45.7 % (ref 34.0–46.6)
Hemoglobin: 15.1 g/dL (ref 11.1–15.9)
Hepatitis B Surface Ag: NEGATIVE
Immature Grans (Abs): 0 10*3/uL (ref 0.0–0.1)
Immature Granulocytes: 0 %
Leukocytes,UA: NEGATIVE
Lymphocytes Absolute: 1.3 10*3/uL (ref 0.7–3.1)
Lymphs: 15 %
MCH: 30.4 pg (ref 26.6–33.0)
MCHC: 33 g/dL (ref 31.5–35.7)
MCV: 92 fL (ref 79–97)
Monocytes Absolute: 0.4 10*3/uL (ref 0.1–0.9)
Monocytes: 5 %
Neisseria Gonorrhoeae by PCR: NEGATIVE
Neutrophils Absolute: 6.7 10*3/uL (ref 1.4–7.0)
Neutrophils: 79 %
Nitrite, UA: NEGATIVE
Platelets: 260 10*3/uL (ref 150–450)
RBC, UA: NEGATIVE
RBC: 4.96 x10E6/uL (ref 3.77–5.28)
RDW: 12.1 % (ref 11.7–15.4)
RPR Ser Ql: NONREACTIVE
Rh Factor: POSITIVE
Rubella Antibodies, IGG: 3.44 {index} (ref >0.99)
Specific Gravity, UA: 1.022 (ref 1.005–1.030)
Urobilinogen, Ur: 1 mg/dL (ref 0.2–1.0)
WBC: 8.4 10*3/uL (ref 3.4–10.8)
pH, UA: 8 — ABNORMAL HIGH (ref 5.0–7.5)

## 2022-12-15 LAB — T4F: T4,Free (Direct): 1.97 ng/dL — ABNORMAL HIGH (ref 0.82–1.77)

## 2022-12-15 LAB — HEMOGLOBIN A1C
Est. average glucose Bld gHb Est-mCnc: 94 mg/dL
Hgb A1c MFr Bld: 4.9 % (ref 4.8–5.6)

## 2022-12-15 LAB — MICROSCOPIC EXAMINATION
Casts: NONE SEEN /[LPF]
RBC, Urine: NONE SEEN /[HPF] (ref 0–2)
WBC, UA: NONE SEEN /[HPF] (ref 0–5)

## 2022-12-15 LAB — TSH RFX ON ABNORMAL TO FREE T4: TSH: 0.312 u[IU]/mL — ABNORMAL LOW (ref 0.450–4.500)

## 2022-12-15 LAB — URINE CULTURE, OB REFLEX

## 2022-12-24 ENCOUNTER — Encounter: Payer: Self-pay | Admitting: Obstetrics and Gynecology

## 2022-12-25 ENCOUNTER — Encounter (HOSPITAL_COMMUNITY): Payer: Self-pay | Admitting: Obstetrics & Gynecology

## 2022-12-25 ENCOUNTER — Inpatient Hospital Stay (HOSPITAL_COMMUNITY)
Admission: AD | Admit: 2022-12-25 | Discharge: 2022-12-25 | Disposition: A | Discharge status: Home / Self Care | Payer: BC Managed Care – PPO | Attending: Obstetrics & Gynecology | Admitting: Obstetrics & Gynecology

## 2022-12-25 DIAGNOSIS — K5900 Constipation, unspecified: Secondary | ICD-10-CM | POA: Insufficient documentation

## 2022-12-25 DIAGNOSIS — Z3A14 14 weeks gestation of pregnancy: Secondary | ICD-10-CM

## 2022-12-25 DIAGNOSIS — O219 Vomiting of pregnancy, unspecified: Secondary | ICD-10-CM

## 2022-12-25 DIAGNOSIS — O211 Hyperemesis gravidarum with metabolic disturbance: Secondary | ICD-10-CM | POA: Diagnosis not present

## 2022-12-25 DIAGNOSIS — R112 Nausea with vomiting, unspecified: Secondary | ICD-10-CM | POA: Diagnosis not present

## 2022-12-25 DIAGNOSIS — O210 Mild hyperemesis gravidarum: Secondary | ICD-10-CM | POA: Diagnosis not present

## 2022-12-25 DIAGNOSIS — O99612 Diseases of the digestive system complicating pregnancy, second trimester: Secondary | ICD-10-CM | POA: Insufficient documentation

## 2022-12-25 HISTORY — DX: Encounter for procreative management, unspecified: Z31.9

## 2022-12-25 HISTORY — DX: Hypothyroidism, unspecified: E03.9

## 2022-12-25 LAB — URINALYSIS, ROUTINE W REFLEX MICROSCOPIC
Bilirubin Urine: NEGATIVE
Glucose, UA: NEGATIVE mg/dL
Hgb urine dipstick: NEGATIVE
Ketones, ur: 80 mg/dL — AB
Leukocytes,Ua: NEGATIVE
Nitrite: NEGATIVE
Protein, ur: 30 mg/dL — AB
Specific Gravity, Urine: 1.021 (ref 1.005–1.030)
pH: 7 (ref 5.0–8.0)

## 2022-12-25 MED ORDER — SCOPOLAMINE 1 MG/3DAYS TD PT72: 1.0000 | MEDICATED_PATCH | TRANSDERMAL | 4 refills | Status: DC

## 2022-12-25 MED ORDER — SCOPOLAMINE 1 MG/3DAYS TD PT72: 1.0000 | MEDICATED_PATCH | TRANSDERMAL | Status: DC

## 2022-12-25 MED ORDER — ONDANSETRON HCL 4 MG/2ML IJ SOLN
4.0000 mg | Freq: Once | INTRAMUSCULAR | Status: AC
  Administered 2022-12-25: 4 mg via INTRAVENOUS
  Filled 2022-12-25: qty 2

## 2022-12-25 MED ORDER — PROMETHAZINE HCL 25 MG/ML IJ SOLN
25.0000 mg | Freq: Once | INTRAVENOUS | Status: AC
  Administered 2022-12-25: 25 mg via INTRAVENOUS
  Filled 2022-12-25: qty 1

## 2022-12-25 MED ORDER — ONDANSETRON 8 MG PO TBDP: 4.0000 mg | ORAL_TABLET | Freq: Three times a day (TID) | ORAL | 3 refills | Status: DC | PRN

## 2022-12-25 MED ORDER — LACTATED RINGERS IV BOLUS
1000.0000 mL | Freq: Once | INTRAVENOUS | Status: AC
  Administered 2022-12-25: 1000 mL via INTRAVENOUS

## 2022-12-25 MED ORDER — PROMETHAZINE HCL 25 MG PO TABS: 25.0000 mg | ORAL_TABLET | Freq: Four times a day (QID) | ORAL | 3 refills | Status: DC | PRN

## 2022-12-25 NOTE — MAU Provider Note (Signed)
History of Present Illness The patient, who is pregnant, presents with severe nausea and vomiting that has not been controlled by current medications, Zofran and Reglan had been helping.  Ran out of Zofran for a few days due to insurance not covering more.  Restarted Zofran but has not been able to get nausea and vomiting under control.  Unable to keep down pills. The patient reports that the vomiting is so severe that she is unable to keep food down, leading to weight loss of 10 pounds. The patient also reports excess saliva and some constipation. The patient has not tried Phenergan or a scopolamine patch. The patient has not reported any fever, chills, diarrhea, stomach pain, or vaginal bleeding. The patient has been vomiting approximately ten times per day for the past few days.  Past Medical History:  Diagnosis Date   Anemia    Hypothyroidism    Infertility management    Past Surgical History:  Procedure Laterality Date   MOUTH SURGERY  04/2020   OTHER SURGICAL HISTORY     surg/procedures associated with IVF (egg retrieval, embryo transfer, etc)   ROS: See HPI.  Exam Patient Vitals for the past 24 hrs:  BP Temp Temp src Pulse Resp SpO2 Height Weight  12/25/22 1847 113/80 -- -- 79 15 -- -- --  12/25/22 1751 109/61 -- -- 70 -- -- -- --  12/25/22 1655 101/62 -- -- 61 -- -- -- --  12/25/22 1450 (!) 120/90 -- -- 87 -- 97 % -- --  12/25/22 1431 115/81 97.7 F (36.5 C) Oral 96 18 98 % -- --  12/25/22 1428 -- -- -- -- -- -- 5\' 4"  (1.626 m) 50.1 kg   General: Underweight patient in no apparent distress.  Ill-appearing.  Mucous membranes moist Heart: Regular rate Lungs: Regular effort and rate Abdomen: Nontender.  Gravid, size equals dates GU: Deferred  Fetal heart rate 155  Assessment & Plan Hyperemesis Gravidarum Severe nausea and vomiting with weight loss. Current regimen of Zofran and Reglan not providing adequate relief which may be due to lapse and Zofran. No fever, chills,  diarrhea, stomach pain, or vaginal bleeding. Mild constipation likely secondary to Zofran and dehydration. -Increase Zofran to 8mg  every 8 hours.  Recommend feeling at [facility] or [facility] outpatient pharmacy so that doses beyond what is covered by insurance would be affordable to pay out-of-pocket. -Administer IV Zofran and Phenergan today.  2 L IV fluids given.  Patient reports looking much better.  Tolerating p.o.'s. -Consider scheduled use of Reglan during the day and Phenergan at night, avoiding use within 6 hours of each other. -Rx scopolamine patch for excess saliva. -Consider use of MiraLAX for constipation. -Encourage small, frequent meals and fluids. -Check weight and symptoms at next appointment on 01/06/2023.  General Health Maintenance -Discussed available childbirth and infant care classes.  Discharge home in stable condition. Hyperemesis precautions  Follow-up Information     [facility] Follow up.   Specialty: Obstetrics and Gynecology Why: Routine prenatal visit as scheduled or sooner as needed if symptoms worsen Contact information: [address] [place] [address] [place] [address] [phone]        [facility] Follow up.   Specialty: Obstetrics and Gynecology Why: As needed in emergencies Contact information: [address] [place] [address] [phone]               Allergies as of 12/25/2022  No Known Allergies      Medication List     TAKE these medications    aspirin 81 MG chewable tablet Chew 81 mg by mouth daily.   famotidine 20 MG tablet Commonly known as: Pepcid Take 1 tablet (20 mg total) by mouth 2 (two) times daily.   Fish Oil 1000 MG Caps Take 1,000 mg by mouth daily.   levothyroxine 50 MCG tablet Commonly known as: SYNTHROID TAKE 1 TABLET BY MOUTH EVERY DAY   metoCLOPramide 10 MG tablet Commonly known as:  REGLAN Take 1 tablet (10 mg total) by mouth 4 (four) times daily as needed for nausea or vomiting.   ondansetron 8 MG disintegrating tablet Commonly known as: ZOFRAN-ODT Take 0.5 tablets (4 mg total) by mouth every 8 (eight) hours as needed for nausea. What changed:  medication strength when to take this   PRENATAL VITAMIN PO Take by mouth.   promethazine 25 MG tablet Commonly known as: PHENERGAN Take 1 tablet (25 mg total) by mouth every 6 (six) hours as needed for nausea or vomiting.   scopolamine 1 MG/3DAYS Commonly known as: TRANSDERM-SCOP Place 1 patch (1.5 mg total) onto the skin every 3 (three) days.        [place], [place] 12/25/2022 9:33 PM

## 2022-12-25 NOTE — MAU Note (Addendum)
..  Renee Jacobs is a 32 y.o. at [redacted]w[redacted]d here in MAU reporting: Severe N/V over the past 48 hours. She reports she has Reglan and Zofran at home that usually work for her but she has not been able to keep anything down consistently for the past 48 hours. She reports her OB office told her to sip water every 5 minutes for four hours. She reports she has tried this today but every 2-3 sips she vomits. Patient vomiting in triage. Mucous membranes and lips dry. Denies VB.   Last doses: Zofran 1040 Reglan 0500  Onset of complaint: x2 days Pain score: Denies pain.  FHT: 155 doppler Lab orders placed from triage: UA

## 2022-12-27 ENCOUNTER — Encounter: Payer: Self-pay | Admitting: Obstetrics & Gynecology

## 2022-12-27 ENCOUNTER — Telehealth (INDEPENDENT_AMBULATORY_CARE_PROVIDER_SITE_OTHER): Payer: BC Managed Care – PPO | Admitting: "Endocrinology

## 2022-12-27 ENCOUNTER — Encounter: Payer: Self-pay | Admitting: "Endocrinology

## 2022-12-27 ENCOUNTER — Encounter: Payer: Self-pay | Admitting: *Deleted

## 2022-12-27 ENCOUNTER — Other Ambulatory Visit: Payer: BC Managed Care – PPO

## 2022-12-27 DIAGNOSIS — O210 Mild hyperemesis gravidarum: Secondary | ICD-10-CM | POA: Diagnosis not present

## 2022-12-27 DIAGNOSIS — Z3A09 9 weeks gestation of pregnancy: Secondary | ICD-10-CM | POA: Diagnosis not present

## 2022-12-27 DIAGNOSIS — E038 Other specified hypothyroidism: Secondary | ICD-10-CM | POA: Diagnosis not present

## 2022-12-27 NOTE — Progress Notes (Signed)
The patient reports they are currently: [name]. I spent 8 minutes on the video with the patient on the date of service. I spent an additional 10 minutes on pre- and post-visit activities on the date of service.   The patient was physically located in [place] or a state in which I am permitted to provide care. The patient and/or parent/guardian understood that s/he may incur co-pays and cost sharing, and agreed to the telemedicine visit. The visit was reasonable and appropriate under the circumstances given the patient's presentation at the time.  The patient and/or parent/guardian has been advised of the potential risks and limitations of this mode of treatment (including, but not limited to, the absence of in-person examination) and has agreed to be treated using telemedicine. The patient's/patient's family's questions regarding telemedicine have been answered.   The patient and/or parent/guardian has also been advised to contact their provider's office for worsening conditions, and seek emergency medical treatment and/or call 911 if the patient deems either necessary.     Outpatient Endocrinology Note Altamese [place], MD  12/27/22   Renee Jacobs Oct 08, 1990 [id number]  Referring Provider: No ref. provider found Primary Care Provider: Pcp, No Subjective  No chief complaint on file.   Assessment & Plan  Diagnoses and all orders for this visit:  Subclinical hypothyroidism -     TSH -     T4, free  Hyperemesis gravidarum    Renee Jacobs is currently taking levothyroxine 50 mcg every day. Start on 09/27/22 started by me to help assist with ongoing IVF induced pregnancy.   Patient is currently biochemically hyperthyroid, with levothyroxine.  Highest TSH off of levothyroxine per pt was in 6.   Educated on thyroid axis.  Recommend the following: Take one pill of levothyroxine 50 mcg every morning (except Sunday).  Repeat labs before next visit.  Advised previously to take  levothyroxine first thing in the morning on empty stomach and wait at least 30 minutes to 1 hour before eating or drinking anything or taking any other medications. Space out levothyroxine by 4 hours from any acid reflux medication/fibrate/iron/calcium/multivitamin. Advised to take birth control pills and nutritional supplements in the evening. Repeat lab before next visit or sooner if symptoms of hyperthyroidism or hypothyroidism develop.  Notify us immediately in case of pregnancy/breastfeeding or significant weight gain or loss. Counseled on compliance and follow up needs.  Discussed extensively ways to cope up with morning sickness, improve oral intake and healthy weight gain during pregnancy   I have reviewed current medications, nurse's notes, allergies, vital signs, past medical and surgical history, family medical history, and social history for this encounter. Counseled patient on symptoms, examination findings, lab findings, imaging results, treatment decisions and monitoring and prognosis. The patient understood the recommendations and agrees with the treatment plan. All questions regarding treatment plan were fully answered.   Return in about 4 weeks (around 01/24/2023).   Altamese [place], MD  12/27/22   I have reviewed current medications, nurse's notes, allergies, vital signs, past medical and surgical history, family medical history, and social history for this encounter. Counseled patient on symptoms, examination findings, lab findings, imaging results, treatment decisions and monitoring and prognosis. The patient understood the recommendations and agrees with the treatment plan. All questions regarding treatment plan were fully answered.   History of Present Illness Renee Jacobs is a 32 y.o. year old female who presents to our clinic with subclinical hypothyroidism diagnosed in 2023.   Patient is 9 weeks 5  days pregnant. Lost 4 lbs due to morning sickness.   Lost 10 lbs in 3  mo Has been diagnosed with hyperemesis gravidarum Currently at 33 weeks On zofran, reglan and phenergan and scopolamine  On Levothyroxine 50 mcg every day  __________  Initial history:  Highest TSH off of levothyroxine per pt was in 6.   Started attempts on conceiving in Nov 2022 Has a miscarriage at 6 weeks in 02/2021 On fertility treatment in 01/2022 with [facility] in [place]  She is on IVF treatment since 05/2022 Currently pregnant per pt   Symptoms suggestive of HYPOTHYROIDISM:  fatigue Yes, improved  weight gain No cold intolerance  No constipation  No  Compressive symptoms:  dysphagia  No dysphonia  No positional dyspnea (especially with simultaneous arms elevation)  No  Smokes  No On biotin  No Personal history of head/neck surgery/irradiation  No  Physical Exam  LMP 09/15/2022  Constitutional: well developed, well nourished Head: normocephalic, atraumatic, no exophthalmos Eyes: sclera anicteric, no redness Neck: no thyromegaly, no thyroid tenderness; no nodules palpated Lungs: normal respiratory effort Neurology: alert and oriented, no fine hand tremor Skin: dry, no appreciable rashes Musculoskeletal: no appreciable defects Psychiatric: normal mood and affect  Allergies No Known Allergies  Current Medications Patient's Medications  New Prescriptions   No medications on file  Previous Medications   ASPIRIN 81 MG CHEWABLE TABLET    Chew 81 mg by mouth daily.   FAMOTIDINE (PEPCID) 20 MG TABLET    Take 1 tablet (20 mg total) by mouth 2 (two) times daily.   LEVOTHYROXINE (SYNTHROID) 50 MCG TABLET    TAKE 1 TABLET BY MOUTH EVERY DAY   METOCLOPRAMIDE (REGLAN) 10 MG TABLET    Take 1 tablet (10 mg total) by mouth 4 (four) times daily as needed for nausea or vomiting.   OMEGA-3 FATTY ACIDS (FISH OIL) 1000 MG CAPS    Take 1,000 mg by mouth daily.   ONDANSETRON (ZOFRAN-ODT) 8 MG DISINTEGRATING TABLET    Take 0.5 tablets (4 mg total) by mouth  every 8 (eight) hours as needed for nausea.   PRENATAL VIT-FE FUMARATE-FA (PRENATAL VITAMIN PO)    Take by mouth.   PROMETHAZINE (PHENERGAN) 25 MG TABLET    Take 1 tablet (25 mg total) by mouth every 6 (six) hours as needed for nausea or vomiting.   SCOPOLAMINE (TRANSDERM-SCOP) 1 MG/3DAYS    Place 1 patch (1.5 mg total) onto the skin every 3 (three) days.  Modified Medications   No medications on file  Discontinued Medications   No medications on file    Past Medical History Past Medical History:  Diagnosis Date   Anemia    Hypothyroidism    Infertility management     Past Surgical History Past Surgical History:  Procedure Laterality Date   MOUTH SURGERY  04/2020   OTHER SURGICAL HISTORY     surg/procedures associated with IVF (egg retrieval, embryo transfer, etc)    Family History family history includes Healthy in her mother; Hyperlipidemia in her father; Hypertension in her father; Ovarian cysts in her mother.  Social History Social History   Socioeconomic History   Marital status: Married    Spouse name: Not on file   Number of children: Not on file   Years of education: Not on file   Highest education level: Not on file  Occupational History   Not on file  Tobacco Use   Smoking status: Never   Smokeless tobacco: Never  Vaping Use  Vaping status: Never Used  Substance and Sexual Activity   Alcohol use: Not Currently    Comment: occ   Drug use: Never   Sexual activity: Yes  Other Topics Concern   Not on file  Social History Narrative   Not on file   Social Determinants of Health   Financial Resource Strain: Not on file  Food Insecurity: Not on file  Transportation Needs: Not on file  Physical Activity: Not on file  Stress: Not on file  Social Connections: Not on file  Intimate Partner Violence: Not on file    Laboratory Investigations Lab Results  Component Value Date   TSH 0.312 (L) 12/11/2022   TSH 0.261 (L) 11/12/2022   TSH 2.58 10/10/2022    FREET4 1.26 10/10/2022   FREET4 0.90 09/26/2022     No results found for: "TSI"   No components found for: "TRAB"   No results found for: "CHOL" No results found for: "HDL" No results found for: "LDLCALC" No results found for: "TRIG" No results found for: "CHOLHDL" No results found for: "CREATININE" No results found for: "GFR" No results found for: "NA", "K", "CL", "CO2", "GLUCOSE", "BUN", "CREATININE", "CALCIUM", "PROT", "ALBUMIN", "AST", "ALT", "ALKPHOS", "BILITOT", "GFRNONAA", "GFRAA"     No data to display             Component Value Date/Time   WBC 8.4 12/11/2022 1013   WBC 6.9 03/19/2021 1056   RBC 4.96 12/11/2022 1013   RBC 4.56 03/19/2021 1056   HGB 15.1 12/11/2022 1013   HCT 45.7 12/11/2022 1013   PLT 260 12/11/2022 1013   MCV 92 12/11/2022 1013   MCH 30.4 12/11/2022 1013   MCH 30.9 03/19/2021 1056   MCHC 33.0 12/11/2022 1013   MCHC 35.0 03/19/2021 1056   RDW 12.1 12/11/2022 1013   LYMPHSABS 1.3 12/11/2022 1013   EOSABS 0.0 12/11/2022 1013   BASOSABS 0.0 12/11/2022 1013      Parts of this note may have been dictated using voice recognition software. There may be variances in spelling and vocabulary which are unintentional. Not all errors are proofread. Please notify the Thereasa Parkin if any discrepancies are noted or if the meaning of any statement is not clear.

## 2023-01-06 ENCOUNTER — Encounter: Payer: BC Managed Care – PPO | Admitting: Obstetrics & Gynecology

## 2023-01-10 ENCOUNTER — Ambulatory Visit (INDEPENDENT_AMBULATORY_CARE_PROVIDER_SITE_OTHER): Payer: BC Managed Care – PPO | Admitting: *Deleted

## 2023-01-10 ENCOUNTER — Telehealth: Payer: Self-pay

## 2023-01-10 VITALS — BP 108/70 | HR 79 | Wt 114.0 lb

## 2023-01-10 DIAGNOSIS — Z3A16 16 weeks gestation of pregnancy: Secondary | ICD-10-CM

## 2023-01-10 DIAGNOSIS — Z3492 Encounter for supervision of normal pregnancy, unspecified, second trimester: Secondary | ICD-10-CM

## 2023-01-10 DIAGNOSIS — Z3490 Encounter for supervision of normal pregnancy, unspecified, unspecified trimester: Secondary | ICD-10-CM | POA: Diagnosis not present

## 2023-01-10 NOTE — Telephone Encounter (Signed)
Left message for patient to call back - would like to move Detail ultrasound on 12/9 to 10:30am in room 5 - patient in room 5 is requesting 7:30am ultrasound slot.

## 2023-01-10 NOTE — Progress Notes (Signed)
Pt here for ROB appt.  Provider not in office due to scheduling issues.  Pt to have AFP drawn today and FHT heard with doppler.  Pt states that she does not have any questions today.  She does wish to get her FLU vaccine at next visit.

## 2023-01-12 LAB — AFP, SERUM, OPEN SPINA BIFIDA
AFP MoM: 1.95
AFP Value: 77.7 ng/mL
Gest. Age on Collection Date: 16 wk
Maternal Age At EDD: 33.6 a
OSBR Risk 1 IN: 901
Test Results:: NEGATIVE
Weight: 114 [lb_av]

## 2023-01-14 DIAGNOSIS — Z3402 Encounter for supervision of normal first pregnancy, second trimester: Secondary | ICD-10-CM | POA: Diagnosis not present

## 2023-01-21 ENCOUNTER — Other Ambulatory Visit: Payer: Self-pay

## 2023-01-21 DIAGNOSIS — Z3482 Encounter for supervision of other normal pregnancy, second trimester: Secondary | ICD-10-CM

## 2023-01-21 DIAGNOSIS — Z3402 Encounter for supervision of normal first pregnancy, second trimester: Secondary | ICD-10-CM

## 2023-01-27 ENCOUNTER — Ambulatory Visit: Payer: BC Managed Care – PPO

## 2023-01-27 ENCOUNTER — Encounter: Payer: Self-pay | Admitting: *Deleted

## 2023-01-27 ENCOUNTER — Other Ambulatory Visit: Payer: Self-pay

## 2023-01-27 ENCOUNTER — Ambulatory Visit: Payer: BC Managed Care – PPO | Admitting: *Deleted

## 2023-01-27 ENCOUNTER — Ambulatory Visit: Payer: BC Managed Care – PPO | Attending: Obstetrics and Gynecology

## 2023-01-27 ENCOUNTER — Other Ambulatory Visit: Payer: Self-pay | Admitting: *Deleted

## 2023-01-27 VITALS — BP 101/69 | HR 87

## 2023-01-27 DIAGNOSIS — O09812 Supervision of pregnancy resulting from assisted reproductive technology, second trimester: Secondary | ICD-10-CM | POA: Insufficient documentation

## 2023-01-27 DIAGNOSIS — E039 Hypothyroidism, unspecified: Secondary | ICD-10-CM | POA: Diagnosis not present

## 2023-01-27 DIAGNOSIS — Z3A19 19 weeks gestation of pregnancy: Secondary | ICD-10-CM | POA: Diagnosis not present

## 2023-01-27 DIAGNOSIS — Z362 Encounter for other antenatal screening follow-up: Secondary | ICD-10-CM

## 2023-01-27 DIAGNOSIS — Z3490 Encounter for supervision of normal pregnancy, unspecified, unspecified trimester: Secondary | ICD-10-CM | POA: Insufficient documentation

## 2023-01-27 DIAGNOSIS — O99280 Endocrine, nutritional and metabolic diseases complicating pregnancy, unspecified trimester: Secondary | ICD-10-CM

## 2023-01-27 DIAGNOSIS — O99282 Endocrine, nutritional and metabolic diseases complicating pregnancy, second trimester: Secondary | ICD-10-CM | POA: Diagnosis not present

## 2023-01-27 DIAGNOSIS — O09819 Supervision of pregnancy resulting from assisted reproductive technology, unspecified trimester: Secondary | ICD-10-CM | POA: Diagnosis not present

## 2023-01-27 DIAGNOSIS — Z363 Encounter for antenatal screening for malformations: Secondary | ICD-10-CM | POA: Insufficient documentation

## 2023-01-27 DIAGNOSIS — Z3689 Encounter for other specified antenatal screening: Secondary | ICD-10-CM

## 2023-01-28 ENCOUNTER — Encounter: Payer: Self-pay | Admitting: Obstetrics and Gynecology

## 2023-01-28 DIAGNOSIS — O43192 Other malformation of placenta, second trimester: Secondary | ICD-10-CM | POA: Insufficient documentation

## 2023-01-30 ENCOUNTER — Encounter: Payer: Self-pay | Admitting: Obstetrics and Gynecology

## 2023-02-04 ENCOUNTER — Telehealth: Payer: BC Managed Care – PPO | Admitting: "Endocrinology

## 2023-02-06 ENCOUNTER — Ambulatory Visit (INDEPENDENT_AMBULATORY_CARE_PROVIDER_SITE_OTHER): Payer: BC Managed Care – PPO | Admitting: Obstetrics and Gynecology

## 2023-02-06 ENCOUNTER — Encounter: Payer: Self-pay | Admitting: Obstetrics and Gynecology

## 2023-02-06 VITALS — BP 114/82 | HR 98 | Wt 114.0 lb

## 2023-02-06 DIAGNOSIS — Z23 Encounter for immunization: Secondary | ICD-10-CM

## 2023-02-06 DIAGNOSIS — Z3490 Encounter for supervision of normal pregnancy, unspecified, unspecified trimester: Secondary | ICD-10-CM | POA: Diagnosis not present

## 2023-02-06 DIAGNOSIS — E039 Hypothyroidism, unspecified: Secondary | ICD-10-CM

## 2023-02-06 DIAGNOSIS — O43192 Other malformation of placenta, second trimester: Secondary | ICD-10-CM

## 2023-02-06 DIAGNOSIS — O09812 Supervision of pregnancy resulting from assisted reproductive technology, second trimester: Secondary | ICD-10-CM

## 2023-02-06 DIAGNOSIS — O99282 Endocrine, nutritional and metabolic diseases complicating pregnancy, second trimester: Secondary | ICD-10-CM

## 2023-02-06 DIAGNOSIS — Z3A20 20 weeks gestation of pregnancy: Secondary | ICD-10-CM

## 2023-02-06 NOTE — Progress Notes (Signed)
   PRENATAL VISIT NOTE  Subjective:  Renee Jacobs is a 32 y.o. G2P0010 at [redacted]w[redacted]d being seen today for ongoing prenatal care.  She is currently monitored for the following issues for this high-risk pregnancy and has Supervision of normal pregnancy; Pregnancy resulting from in vitro fertilization; Hypothyroidism; and Marginal insertion of umbilical cord affecting management of mother in second trimester on their problem list.  Patient reports no complaints.  Contractions: Not present. Vag. Bleeding: None.  Movement: Present. Denies leaking of fluid.   The following portions of the patient's history were reviewed and updated as appropriate: allergies, current medications, past family history, past medical history, past social history, past surgical history and problem list.   Objective:   Vitals:   02/06/23 0905  BP: 114/82  Pulse: 98  Weight: 114 lb (51.7 kg)    Fetal Status: Fetal Heart Rate (bpm): 140 Fundal Height: 20 cm Movement: Present     General:  Alert, oriented and cooperative. Patient is in no acute distress.  Skin: Skin is warm and dry. No rash noted.   Cardiovascular: Normal heart rate noted  Respiratory: Normal respiratory effort, no problems with respiration noted  Abdomen: Soft, gravid, appropriate for gestational age.  Pain/Pressure: Absent     Pelvic: Cervical exam deferred        Extremities: Normal range of motion.  Edema: None  Mental Status: Normal mood and affect. Normal behavior. Normal judgment and thought content.   Assessment and Plan:  Pregnancy: G2P0010 at [redacted]w[redacted]d 1. Encounter for supervision of normal pregnancy, antepartum, unspecified gravidity (Primary) Patient is doing well without complaints Reassurance provided regarding uptick in weight gain in second and third trimester. Patient reports hyperemesis significantly improved and has gained 5 lb since. Discussed  goal of no more than 20 lb healthy weight gain Encourage high protein meals and snacks    2. Marginal insertion of umbilical cord affecting management of mother in second trimester Follow up growth ultrasound on 02/27/23  3. Hypothyroidism, unspecified type Labs today Continue Synthroid Patient scheduled to follow up with endocrinologist on 02/20/23  4. Pregnancy resulting from in vitro fertilization in second trimester Continue ASA  Preterm labor symptoms and general obstetric precautions including but not limited to vaginal bleeding, contractions, leaking of fluid and fetal movement were reviewed in detail with the patient. Please refer to After Visit Summary for other counseling recommendations.   Return in about 4 weeks (around 03/06/2023) for in person, ROB, High risk.  Future Appointments  Date Time Provider Department Center  02/20/2023  8:20 AM Renee [place], MD [facility] None  02/27/2023  9:15 AM [facility] NURSE [facility]  02/27/2023  9:30 AM [facility] US4 [facility]    Renee [place], MD

## 2023-02-06 NOTE — Progress Notes (Signed)
Pt is concerned about minimal weight gain

## 2023-02-07 ENCOUNTER — Encounter: Payer: Self-pay | Admitting: "Endocrinology

## 2023-02-07 LAB — T3, FREE: T3, Free: 2.7 pg/mL (ref 2.0–4.4)

## 2023-02-07 LAB — T4, FREE: Free T4: 1.05 ng/dL (ref 0.82–1.77)

## 2023-02-07 LAB — TSH: TSH: 1.59 u[IU]/mL (ref 0.450–4.500)

## 2023-02-09 LAB — HORIZON CUSTOM

## 2023-02-20 ENCOUNTER — Encounter: Payer: Self-pay | Admitting: "Endocrinology

## 2023-02-20 ENCOUNTER — Telehealth (INDEPENDENT_AMBULATORY_CARE_PROVIDER_SITE_OTHER): Payer: BC Managed Care – PPO | Admitting: "Endocrinology

## 2023-02-20 VITALS — Ht 64.0 in

## 2023-02-20 DIAGNOSIS — E038 Other specified hypothyroidism: Secondary | ICD-10-CM

## 2023-02-20 DIAGNOSIS — Z3A22 22 weeks gestation of pregnancy: Secondary | ICD-10-CM

## 2023-02-20 DIAGNOSIS — O210 Mild hyperemesis gravidarum: Secondary | ICD-10-CM | POA: Diagnosis not present

## 2023-02-20 NOTE — Progress Notes (Signed)
 The patient reports they are currently: [place]. I spent 8 minutes on the video with the patient on the date of service. I spent an additional 10 minutes on pre- and post-visit activities on the date of service.   The patient was physically located in [place]  or a state in which I am permitted to provide care. The patient and/or parent/guardian understood that s/he may incur co-pays and cost sharing, and agreed to the telemedicine visit. The visit was reasonable and appropriate under the circumstances given the patient's presentation at the time.  The patient and/or parent/guardian has been advised of the potential risks and limitations of this mode of treatment (including, but not limited to, the absence of in-person examination) and has agreed to be treated using telemedicine. The patient's/patient's family's questions regarding telemedicine have been answered.   The patient and/or parent/guardian has also been advised to contact their provider's office for worsening conditions, and seek emergency medical treatment and/or call 911 if the patient deems either necessary.     Outpatient Endocrinology Note Renee [place], MD  02/20/23   Renee Jacobs Nov 26, 1990 [id number]  Referring Provider: No ref. provider found Primary Care Provider: Pcp, No Subjective  Chief Complaint  Patient presents with   Follow-up    Assessment & Plan  Emelyn was seen today for follow-up.  Diagnoses and all orders for this visit:  Subclinical hypothyroidism -     T3, free -     T4, free -     TSH  Hyperemesis gravidarum     Renee Jacobs is currently taking levothyroxine  50 mcg every day. Started on 09/27/22 to help assist with ongoing IVF induced pregnancy.   Patient is currently biochemically euthyroid, with levothyroxine .  Highest TSH off of levothyroxine  per pt was in 6.   Educated on thyroid  axis.  Recommend the following: Continue current dose. Take one pill of levothyroxine  50 mcg every  morning.  Repeat labs before next visit.  Advised previously to take levothyroxine  first thing in the morning on empty stomach and wait at least 30 minutes to 1 hour before eating or drinking anything or taking any other medications. Space out levothyroxine  by 4 hours from any acid reflux medication/fibrate/iron/calcium/multivitamin. Advised to take birth control pills and nutritional supplements in the evening. Repeat lab before next visit or sooner if symptoms of hyperthyroidism or hypothyroidism develop.  Notify us  immediately in case of pregnancy/breastfeeding or significant weight gain or loss. Counseled on compliance and follow up needs.  Discussed ways to cope up with morning sickness, improve oral intake and healthy weight gain during pregnancy   I have reviewed current medications, nurse's notes, allergies, vital signs, past medical and surgical history, family medical history, and social history for this encounter. Counseled patient on symptoms, examination findings, lab findings, imaging results, treatment decisions and monitoring and prognosis. The patient understood the recommendations and agrees with the treatment plan. All questions regarding treatment plan were fully answered.   Return in about 20 days (around 03/12/2023) for tele-visit: 3:20 pm.   Renee [place], MD  02/20/23   I have reviewed current medications, nurse's notes, allergies, vital signs, past medical and surgical history, family medical history, and social history for this encounter. Counseled patient on symptoms, examination findings, lab findings, imaging results, treatment decisions and monitoring and prognosis. The patient understood the recommendations and agrees with the treatment plan. All questions regarding treatment plan were fully answered.   History of Present Illness Renee Jacobs is a 33 y.o.  year old female who presents to our clinic with subclinical hypothyroidism diagnosed in 2023.   Patient is  22 weeks 4-5 days pregnant. Lost 4 lbs due to morning sickness.   Less nausea Reports some weight gain Has been diagnosed with hyperemesis gravidarum On zofran , reglan  and phenergan  and scopolamine   On Levothyroxine  50 mcg every day   Highest TSH off of levothyroxine  per pt was in 6.   Started attempts on conceiving in Nov 2022 Had a miscarriage at 6 weeks in 02/2021 On fertility treatment in 01/2022 with [facility] in [place]  She started IVF treatment since 05/2022  Symptoms suggestive of HYPOTHYROIDISM:  fatigue No Cold/hot intolerance  No constipation Yes   Compressive symptoms:  dysphagia  No dysphonia  No positional dyspnea (especially with simultaneous arms elevation)  No  Smokes  No On biotin  No Personal history of head/neck surgery/irradiation  No   Physical Exam  Ht 5' 4 (1.626 m)   LMP 09/15/2022   BMI 19.57 kg/m  Constitutional: well developed, well nourished Head: normocephalic, atraumatic, no exophthalmos Eyes: sclera anicteric, no redness Neck: no thyromegaly, no thyroid  tenderness; no nodules palpated Lungs: normal respiratory effort Neurology: alert and oriented, no fine hand tremor Skin: dry, no appreciable rashes Musculoskeletal: no appreciable defects Psychiatric: normal mood and affect  Allergies No Known Allergies  Current Medications Patient's Medications  New Prescriptions   No medications on file  Previous Medications   ASPIRIN 81 MG CHEWABLE TABLET    Chew 81 mg by mouth daily.   FAMOTIDINE  (PEPCID ) 20 MG TABLET    Take 1 tablet (20 mg total) by mouth 2 (two) times daily.   LEVOTHYROXINE  (SYNTHROID ) 50 MCG TABLET    TAKE 1 TABLET BY MOUTH EVERY DAY   METOCLOPRAMIDE  (REGLAN ) 10 MG TABLET    Take 1 tablet (10 mg total) by mouth 4 (four) times daily as needed for nausea or vomiting.   OMEGA-3 FATTY ACIDS (FISH OIL) 1000 MG CAPS    Take 1,000 mg by mouth daily.   ONDANSETRON  (ZOFRAN -ODT) 8 MG DISINTEGRATING TABLET     Take 0.5 tablets (4 mg total) by mouth every 8 (eight) hours as needed for nausea.   PRENATAL VIT-FE FUMARATE-FA (PRENATAL VITAMIN PO)    Take by mouth.   PROMETHAZINE  (PHENERGAN ) 25 MG TABLET    Take 1 tablet (25 mg total) by mouth every 6 (six) hours as needed for nausea or vomiting.   SCOPOLAMINE  (TRANSDERM-SCOP) 1 MG/3DAYS    Place 1 patch (1.5 mg total) onto the skin every 3 (three) days.  Modified Medications   No medications on file  Discontinued Medications   No medications on file    Past Medical History Past Medical History:  Diagnosis Date   Anemia    Hypothyroidism    Infertility management     Past Surgical History Past Surgical History:  Procedure Laterality Date   MOUTH SURGERY  04/2020   OTHER SURGICAL HISTORY     surg/procedures associated with IVF (egg retrieval, embryo transfer, etc)    Family History family history includes Healthy in her mother; Hyperlipidemia in her father; Hypertension in her father; Ovarian cysts in her mother.  Social History Social History   Socioeconomic History   Marital status: Married    Spouse name: Not on file   Number of children: Not on file   Years of education: Not on file   Highest education level: Not on file  Occupational History   Not on file  Tobacco Use   Smoking status: Never   Smokeless tobacco: Never  Vaping Use   Vaping status: Never Used  Substance and Sexual Activity   Alcohol use: Not Currently    Comment: occ   Drug use: Never   Sexual activity: Yes  Other Topics Concern   Not on file  Social History Narrative   Not on file   Social Drivers of Health   Financial Resource Strain: Not on file  Food Insecurity: Not on file  Transportation Needs: Not on file  Physical Activity: Not on file  Stress: Not on file  Social Connections: Not on file  Intimate Partner Violence: Not on file    Laboratory Investigations Lab Results  Component Value Date   TSH 1.590 02/06/2023   TSH 0.312 (L)  12/11/2022   TSH 0.261 (L) 11/12/2022   FREET4 1.05 02/06/2023   FREET4 1.26 10/10/2022   FREET4 0.90 09/26/2022     No results found for: TSI   No components found for: TRAB   No results found for: CHOL No results found for: HDL No results found for: LDLCALC No results found for: TRIG No results found for: CHOLHDL No results found for: CREATININE No results found for: GFR No results found for: NA, K, CL, CO2, GLUCOSE, BUN, CREATININE, CALCIUM, PROT, ALBUMIN, AST, ALT, ALKPHOS, BILITOT, GFRNONAA, GFRAA     No data to display             Component Value Date/Time   WBC 8.4 12/11/2022 1013   WBC 6.9 03/19/2021 1056   RBC 4.96 12/11/2022 1013   RBC 4.56 03/19/2021 1056   HGB 15.1 12/11/2022 1013   HCT 45.7 12/11/2022 1013   PLT 260 12/11/2022 1013   MCV 92 12/11/2022 1013   MCH 30.4 12/11/2022 1013   MCH 30.9 03/19/2021 1056   MCHC 33.0 12/11/2022 1013   MCHC 35.0 03/19/2021 1056   RDW 12.1 12/11/2022 1013   LYMPHSABS 1.3 12/11/2022 1013   EOSABS 0.0 12/11/2022 1013   BASOSABS 0.0 12/11/2022 1013      Parts of this note may have been dictated using voice recognition software. There may be variances in spelling and vocabulary which are unintentional. Not all errors are proofread. Please notify the dino if any discrepancies are noted or if the meaning of any statement is not clear.

## 2023-02-27 ENCOUNTER — Other Ambulatory Visit: Payer: Self-pay | Admitting: *Deleted

## 2023-02-27 ENCOUNTER — Other Ambulatory Visit: Payer: Self-pay | Admitting: Obstetrics

## 2023-02-27 ENCOUNTER — Ambulatory Visit: Payer: BC Managed Care – PPO | Attending: Obstetrics

## 2023-02-27 ENCOUNTER — Other Ambulatory Visit: Payer: Self-pay

## 2023-02-27 ENCOUNTER — Ambulatory Visit: Payer: BC Managed Care – PPO | Admitting: *Deleted

## 2023-02-27 VITALS — BP 104/71 | HR 76

## 2023-02-27 DIAGNOSIS — O43199 Other malformation of placenta, unspecified trimester: Secondary | ICD-10-CM

## 2023-02-27 DIAGNOSIS — E039 Hypothyroidism, unspecified: Secondary | ICD-10-CM

## 2023-02-27 DIAGNOSIS — O99282 Endocrine, nutritional and metabolic diseases complicating pregnancy, second trimester: Secondary | ICD-10-CM

## 2023-02-27 DIAGNOSIS — Z3A23 23 weeks gestation of pregnancy: Secondary | ICD-10-CM | POA: Diagnosis not present

## 2023-02-27 DIAGNOSIS — Z362 Encounter for other antenatal screening follow-up: Secondary | ICD-10-CM | POA: Insufficient documentation

## 2023-02-27 DIAGNOSIS — O09812 Supervision of pregnancy resulting from assisted reproductive technology, second trimester: Secondary | ICD-10-CM

## 2023-03-02 NOTE — Progress Notes (Signed)
   PRENATAL VISIT NOTE  Subjective:  Renee Jacobs is a 33 y.o. G2P0010 at [redacted]w[redacted]d being seen today for ongoing prenatal care.  She is currently monitored for the following issues for this high-risk pregnancy and has Supervision of normal pregnancy; Pregnancy resulting from in vitro fertilization; Hypothyroidism; and Marginal insertion of umbilical cord affecting management of mother in second trimester on their problem list.  Patient reports  ongoing n/v. She is able to control it now and tolerate some PO. She is taking the Reglan and Phenergan primarily. Has Zofran on hand if needed .  Contractions: Not present. Vag. Bleeding: None.  Movement: Present. Denies leaking of fluid.   The following portions of the patient's history were reviewed and updated as appropriate: allergies, current medications, past family history, past medical history, past social history, past surgical history and problem list.   Objective:   Vitals:   03/06/23 0950  BP: 107/75  Pulse: 78  Weight: 116 lb (52.6 kg)    Fetal Status: Fetal Heart Rate (bpm): 152   Movement: Present     General:  Alert, oriented and cooperative. Patient is in no acute distress.  Skin: Skin is warm and dry. No rash noted.   Cardiovascular: Normal heart rate noted  Respiratory: Normal respiratory effort, no problems with respiration noted  Abdomen: Soft, gravid, appropriate for gestational age.  Pain/Pressure: Absent     Pelvic: Cervical exam deferred        Extremities: Normal range of motion.  Edema: None  Mental Status: Normal mood and affect. Normal behavior. Normal judgment and thought content.   Assessment and Plan:  Pregnancy: G2P0010 at [redacted]w[redacted]d 1. Hypothyroidism, unspecified type (Primary) Continue synthroid. Follows with Endo.  TSH 3 wk ago was wnl.   2. Encounter for supervision of normal pregnancy, antepartum, unspecified gravidity 28 week labs next time. Discussed fasting. She will pretreat with Zofran that day. If  unable to tolerate 2 hr, may either do CBGs x2 weeks or could try 1 hr.  Discussed tdap next time.  Pt reassured re: weight gain.   3. Pregnancy resulting from in vitro fertilization in second trimester   4. Marginal insertion of umbilical cord affecting management of mother in second trimester 1/9: 25%ile, normal AFI. Not fully confirmed marginal cord insertion but vasa previa ruled out. MFM recommended repeat US around end of February.   5. Pregnancy with 24 completed weeks gestation   Preterm labor symptoms and general obstetric precautions including but not limited to vaginal bleeding, contractions, leaking of fluid and fetal movement were reviewed in detail with the patient. Please refer to After Visit Summary for other counseling recommendations.   Return in about 4 weeks (around 04/03/2023) for OB VISIT, MD or APP.  Future Appointments  Date Time Provider Department Center  03/12/2023  3:20 PM Altamese Kenly, MD [facility] None  04/10/2023  8:30 AM [facility] US4 [facility]    [name], MD

## 2023-03-06 ENCOUNTER — Encounter: Payer: Self-pay | Admitting: Obstetrics and Gynecology

## 2023-03-06 ENCOUNTER — Other Ambulatory Visit: Payer: Self-pay | Admitting: "Endocrinology

## 2023-03-06 ENCOUNTER — Telehealth: Payer: Self-pay | Admitting: "Endocrinology

## 2023-03-06 ENCOUNTER — Ambulatory Visit (INDEPENDENT_AMBULATORY_CARE_PROVIDER_SITE_OTHER): Payer: BC Managed Care – PPO | Admitting: Obstetrics and Gynecology

## 2023-03-06 VITALS — BP 107/75 | HR 78 | Wt 116.0 lb

## 2023-03-06 DIAGNOSIS — E039 Hypothyroidism, unspecified: Secondary | ICD-10-CM

## 2023-03-06 DIAGNOSIS — O09812 Supervision of pregnancy resulting from assisted reproductive technology, second trimester: Secondary | ICD-10-CM

## 2023-03-06 DIAGNOSIS — O43192 Other malformation of placenta, second trimester: Secondary | ICD-10-CM

## 2023-03-06 DIAGNOSIS — Z3490 Encounter for supervision of normal pregnancy, unspecified, unspecified trimester: Secondary | ICD-10-CM

## 2023-03-06 DIAGNOSIS — E038 Other specified hypothyroidism: Secondary | ICD-10-CM | POA: Diagnosis not present

## 2023-03-06 DIAGNOSIS — Z3A24 24 weeks gestation of pregnancy: Secondary | ICD-10-CM

## 2023-03-06 NOTE — Telephone Encounter (Signed)
Labs faxed to number requested

## 2023-03-06 NOTE — Telephone Encounter (Signed)
Patient was advised she could go to [facility] and have her labs done.  She is currently at the [facility] office in [place] to get her labs drawn.  Please fax lab orders to [facility] [phone]

## 2023-03-07 LAB — T4, FREE: Free T4: 1.08 ng/dL (ref 0.82–1.77)

## 2023-03-07 LAB — TSH: TSH: 1.35 u[IU]/mL (ref 0.450–4.500)

## 2023-03-07 LAB — T3, FREE: T3, Free: 2.6 pg/mL (ref 2.0–4.4)

## 2023-03-09 ENCOUNTER — Other Ambulatory Visit: Payer: Self-pay | Admitting: Obstetrics and Gynecology

## 2023-03-09 DIAGNOSIS — O219 Vomiting of pregnancy, unspecified: Secondary | ICD-10-CM

## 2023-03-12 ENCOUNTER — Encounter: Payer: Self-pay | Admitting: "Endocrinology

## 2023-03-12 ENCOUNTER — Telehealth (INDEPENDENT_AMBULATORY_CARE_PROVIDER_SITE_OTHER): Payer: BC Managed Care – PPO | Admitting: "Endocrinology

## 2023-03-12 ENCOUNTER — Encounter: Payer: Self-pay | Admitting: Obstetrics and Gynecology

## 2023-03-12 DIAGNOSIS — E038 Other specified hypothyroidism: Secondary | ICD-10-CM

## 2023-03-12 DIAGNOSIS — Z3A25 25 weeks gestation of pregnancy: Secondary | ICD-10-CM

## 2023-03-12 DIAGNOSIS — O210 Mild hyperemesis gravidarum: Secondary | ICD-10-CM

## 2023-03-12 MED ORDER — LEVOTHYROXINE SODIUM 50 MCG PO TABS: 50.0000 ug | ORAL_TABLET | Freq: Every day | ORAL | 1 refills | Status: DC

## 2023-03-12 NOTE — Progress Notes (Signed)
The patient reports they are currently: [name]. I spent 7-8 minutes on the video with the patient on the date of service. I spent an additional 10 minutes on pre- and post-visit activities on the date of service.   The patient was physically located in [place] or a state in which I am permitted to provide care. The patient and/or parent/guardian understood that s/he may incur co-pays and cost sharing, and agreed to the telemedicine visit. The visit was reasonable and appropriate under the circumstances given the patient's presentation at the time.  The patient and/or parent/guardian has been advised of the potential risks and limitations of this mode of treatment (including, but not limited to, the absence of in-person examination) and has agreed to be treated using telemedicine. The patient's/patient's family's questions regarding telemedicine have been answered.   The patient and/or parent/guardian has also been advised to contact their provider's office for worsening conditions, and seek emergency medical treatment and/or call 911 if the patient deems either necessary.     Outpatient Endocrinology Note Renee [place], MD  03/12/23   Renee Jacobs 10-26-1990 [id number]  Referring Provider: No ref. provider found Primary Care Provider: Pcp, No Subjective  Chief Complaint  Patient presents with   Follow-up    Thyroid     Assessment & Plan  Renee Jacobs was seen today for follow-up.  Diagnoses and all orders for this visit:  Subclinical hypothyroidism -     Cancel: TSH -     Cancel: T4, free -     T4, free; Future -     TSH; Future  Hyperemesis gravidarum  Other orders -     levothyroxine (SYNTHROID) 50 MCG tablet; Take 1 tablet (50 mcg total) by mouth daily.   Renee Jacobs is currently taking levothyroxine 50 mcg every day, skipping sundays . Started on 09/27/22 to help assist with ongoing IVF induced pregnancy.   Patient is currently biochemically euthyroid, with  levothyroxine.  Highest TSH off of levothyroxine per pt was in 6.   Educated on thyroid axis.  Recommend the following: Continue current dose. Take one pill of levothyroxine 50 mcg every morning, skipping sundays.  Repeat labs before next visit.  Advised previously to take levothyroxine first thing in the morning on empty stomach and wait at least 30 minutes to 1 hour before eating or drinking anything or taking any other medications. Space out levothyroxine by 4 hours from any acid reflux medication/fibrate/iron/calcium/multivitamin. Advised to take birth control pills and nutritional supplements in the evening. Repeat lab before next visit or sooner if symptoms of hyperthyroidism or hypothyroidism develop.  Notify us immediately in case of pregnancy/breastfeeding or significant weight gain or loss. Counseled on compliance and follow up needs.  Discussed ways to cope up with morning sickness, improve oral intake and healthy weight gain during pregnancy   I have reviewed current medications, nurse's notes, allergies, vital signs, past medical and surgical history, family medical history, and social history for this encounter. Counseled patient on symptoms, examination findings, lab findings, imaging results, treatment decisions and monitoring and prognosis. The patient understood the recommendations and agrees with the treatment plan. All questions regarding treatment plan were fully answered.   Return in about 6 weeks (around 04/23/2023).   Renee , MD  03/12/23   I have reviewed current medications, nurse's notes, allergies, vital signs, past medical and surgical history, family medical history, and social history for this encounter. Counseled patient on symptoms, examination findings, lab findings, imaging results, treatment decisions  and monitoring and prognosis. The patient understood the recommendations and agrees with the treatment plan. All questions regarding treatment plan were  fully answered.   History of Present Illness Renee Jacobs is a 33 y.o. year old female who presents to our clinic with subclinical hypothyroidism diagnosed in 2023.    Patient is [redacted] weeks pregnant.  Feels okay Has some nausea coming back Weight is stable  Has been diagnosed with hyperemesis gravidarum On zofran, reglan and phenergan and scopolamine  On Levothyroxine 50 mcg every day six days a week, skipping Sundays   Compressive symptoms:  dysphagia  No dysphonia  No positional dyspnea (especially with simultaneous arms elevation)  No  Initial history:  Highest TSH off of levothyroxine per pt was in 6.   Started attempts on conceiving in Nov 2022 Had a miscarriage at 6 weeks in 02/2021 On fertility treatment in 01/2022 with [facility] in [place]  She started IVF treatment since 05/2022  Symptoms suggestive of HYPOTHYROIDISM:  fatigue No Cold/hot intolerance  No constipation Yes             Smokes  No On biotin  No Personal history of head/neck surgery/irradiation  No   Physical Exam  LMP 09/15/2022  Constitutional: well developed, well nourished Head: normocephalic, atraumatic, no exophthalmos Eyes: sclera anicteric, no redness Neck: no thyromegaly, no thyroid tenderness; no nodules palpated Lungs: normal respiratory effort Neurology: alert and oriented, no fine hand tremor Skin: dry, no appreciable rashes Musculoskeletal: no appreciable defects Psychiatric: normal mood and affect  Allergies No Known Allergies  Current Medications Patient's Medications  New Prescriptions   No medications on file  Previous Medications   ASPIRIN 81 MG CHEWABLE TABLET    Chew 81 mg by mouth daily.   METOCLOPRAMIDE (REGLAN) 10 MG TABLET    Take 1 tablet (10 mg total) by mouth 4 (four) times daily as needed for nausea or vomiting.   OMEGA-3 FATTY ACIDS (FISH OIL) 1000 MG CAPS    Take 1,000 mg by mouth daily.   ONDANSETRON (ZOFRAN-ODT) 8 MG DISINTEGRATING  TABLET    Take 0.5 tablets (4 mg total) by mouth every 8 (eight) hours as needed for nausea.   PRENATAL VIT-FE FUMARATE-FA (PRENATAL VITAMIN PO)    Take by mouth.   PROMETHAZINE (PHENERGAN) 25 MG TABLET    Take 1 tablet (25 mg total) by mouth every 6 (six) hours as needed for nausea or vomiting.   SCOPOLAMINE (TRANSDERM-SCOP) 1 MG/3DAYS    Place 1 patch (1.5 mg total) onto the skin every 3 (three) days.  Modified Medications   Modified Medication Previous Medication   LEVOTHYROXINE (SYNTHROID) 50 MCG TABLET levothyroxine (SYNTHROID) 50 MCG tablet      Take 1 tablet (50 mcg total) by mouth daily.    TAKE 1 TABLET BY MOUTH EVERY DAY  Discontinued Medications   No medications on file    Past Medical History Past Medical History:  Diagnosis Date   Anemia    Hypothyroidism    Infertility management     Past Surgical History Past Surgical History:  Procedure Laterality Date   MOUTH SURGERY  04/2020   OTHER SURGICAL HISTORY     surg/procedures associated with IVF (egg retrieval, embryo transfer, etc)    Family History family history includes Healthy in her mother; Hyperlipidemia in her father; Hypertension in her father; Ovarian cysts in her mother.  Social History Social History   Socioeconomic History   Marital status: Married    Spouse name:  Not on file   Number of children: Not on file   Years of education: Not on file   Highest education level: Not on file  Occupational History   Not on file  Tobacco Use   Smoking status: Never   Smokeless tobacco: Never  Vaping Use   Vaping status: Never Used  Substance and Sexual Activity   Alcohol use: Not Currently    Comment: occ   Drug use: Never   Sexual activity: Yes  Other Topics Concern   Not on file  Social History Narrative   Not on file   Social Drivers of Health   Financial Resource Strain: Not on file  Food Insecurity: Not on file  Transportation Needs: Not on file  Physical Activity: Not on file  Stress:  Not on file  Social Connections: Not on file  Intimate Partner Violence: Not on file    Laboratory Investigations Lab Results  Component Value Date   TSH 1.350 03/06/2023   TSH 1.590 02/06/2023   TSH 0.312 (L) 12/11/2022   FREET4 1.08 03/06/2023   FREET4 1.05 02/06/2023   FREET4 1.26 10/10/2022     No results found for: "TSI"   No components found for: "TRAB"   No results found for: "CHOL" No results found for: "HDL" No results found for: "LDLCALC" No results found for: "TRIG" No results found for: "CHOLHDL" No results found for: "CREATININE" No results found for: "GFR" No results found for: "NA", "K", "CL", "CO2", "GLUCOSE", "BUN", "CREATININE", "CALCIUM", "PROT", "ALBUMIN", "AST", "ALT", "ALKPHOS", "BILITOT", "GFRNONAA", "GFRAA"     No data to display             Component Value Date/Time   WBC 8.4 12/11/2022 1013   WBC 6.9 03/19/2021 1056   RBC 4.96 12/11/2022 1013   RBC 4.56 03/19/2021 1056   HGB 15.1 12/11/2022 1013   HCT 45.7 12/11/2022 1013   PLT 260 12/11/2022 1013   MCV 92 12/11/2022 1013   MCH 30.4 12/11/2022 1013   MCH 30.9 03/19/2021 1056   MCHC 33.0 12/11/2022 1013   MCHC 35.0 03/19/2021 1056   RDW 12.1 12/11/2022 1013   LYMPHSABS 1.3 12/11/2022 1013   EOSABS 0.0 12/11/2022 1013   BASOSABS 0.0 12/11/2022 1013      Parts of this note may have been dictated using voice recognition software. There may be variances in spelling and vocabulary which are unintentional. Not all errors are proofread. Please notify the Thereasa Parkin if any discrepancies are noted or if the meaning of any statement is not clear.

## 2023-03-14 ENCOUNTER — Other Ambulatory Visit: Payer: Self-pay

## 2023-03-14 DIAGNOSIS — O219 Vomiting of pregnancy, unspecified: Secondary | ICD-10-CM

## 2023-03-14 MED ORDER — METOCLOPRAMIDE HCL 10 MG PO TABS: 10.0000 mg | ORAL_TABLET | Freq: Four times a day (QID) | ORAL | 2 refills | Status: DC | PRN

## 2023-03-31 NOTE — Progress Notes (Signed)
   PRENATAL VISIT NOTE  Subjective:  Renee Jacobs is a 33 y.o. G2P0010 at [redacted]w[redacted]d being seen today for ongoing prenatal care.  She is currently monitored for the following issues for this high-risk pregnancy and has Supervision of normal pregnancy; Pregnancy resulting from in vitro fertilization; Hypothyroidism; and Marginal insertion of umbilical cord affecting management of mother in second trimester on their problem list.  Patient reports backache and change in color of belly button .  Contractions: Not present. Vag. Bleeding: None.  Movement: Present. Denies leaking of fluid.   The following portions of the patient's history were reviewed and updated as appropriate: allergies, current medications, past family history, past medical history, past social history, past surgical history and problem list.   Objective:   Vitals:   04/03/23 0840  BP: 109/73  Pulse: 69  Weight: 119 lb (54 kg)    Fetal Status: Fetal Heart Rate (bpm): 143   Movement: Present     General:  Alert, oriented and cooperative. Patient is in no acute distress.  Skin: Skin is warm and dry. No rash noted.   Cardiovascular: Normal heart rate noted  Respiratory: Normal respiratory effort, no problems with respiration noted  Abdomen: Soft, gravid, appropriate for gestational age.  Pain/Pressure: Absent      Assessment and Plan:  Pregnancy: G2P0010 at [redacted]w[redacted]d 1. Encounter for supervision of other normal pregnancy in third trimester (Primary) 2. [redacted] weeks gestation of pregnancy 2h GTT, CBC, HIV, RPR Tdap next visit  3. Pregnancy resulting from in vitro fertilization in third trimester ldASA Weekly antenatal testing at 36 weeks  4. Hypothyroidism, subclinical Synthroid M-Sa, skips sunday TSH 1.35 on 1/16 F/w endocrine, last saw 1/22. Would like to stop following with endo and have [place] manage synthroid for remainder of pregnancy due cost of appointments  5. Marginal insertion of umbilical cord affecting  management of mother in third trimester Follow up US scheduled 2/20 No e/o vasa previa  Preterm labor symptoms and general obstetric precautions including but not limited to vaginal bleeding, contractions, leaking of fluid and fetal movement were reviewed in detail with the patient.  Please refer to After Visit Summary for other counseling recommendations.   Return in about 2 weeks (around 04/17/2023) for return OB at 30 weeks.  Future Appointments  Date Time Provider Department Center  04/10/2023  8:30 AM [facility] US4 [facility]  04/17/2023  8:30 AM Lennart Pall, MD [facility]  05/01/2023  8:30 AM [name], MD [facility]  05/15/2023  8:30 AM [name], MD [facility]   Lennart Pall, MD

## 2023-04-03 ENCOUNTER — Ambulatory Visit (INDEPENDENT_AMBULATORY_CARE_PROVIDER_SITE_OTHER): Payer: BC Managed Care – PPO | Admitting: Obstetrics and Gynecology

## 2023-04-03 VITALS — BP 109/73 | HR 69 | Wt 119.0 lb

## 2023-04-03 DIAGNOSIS — E038 Other specified hypothyroidism: Secondary | ICD-10-CM | POA: Diagnosis not present

## 2023-04-03 DIAGNOSIS — Z3483 Encounter for supervision of other normal pregnancy, third trimester: Secondary | ICD-10-CM

## 2023-04-03 DIAGNOSIS — O43193 Other malformation of placenta, third trimester: Secondary | ICD-10-CM

## 2023-04-03 DIAGNOSIS — O09813 Supervision of pregnancy resulting from assisted reproductive technology, third trimester: Secondary | ICD-10-CM | POA: Diagnosis not present

## 2023-04-03 DIAGNOSIS — Z3A28 28 weeks gestation of pregnancy: Secondary | ICD-10-CM

## 2023-04-03 MED ORDER — LEVOTHYROXINE SODIUM 50 MCG PO TABS: 50.0000 ug | ORAL_TABLET | Freq: Every day | ORAL | 3 refills | Status: DC

## 2023-04-03 NOTE — Progress Notes (Signed)
Pt will get Tdap at next appt

## 2023-04-05 LAB — CBC
Hematocrit: 38.1 % (ref 34.0–46.6)
Hemoglobin: 13.4 g/dL (ref 11.1–15.9)
MCH: 33.2 pg — ABNORMAL HIGH (ref 26.6–33.0)
MCHC: 35.2 g/dL (ref 31.5–35.7)
MCV: 94 fL (ref 79–97)
Platelets: 193 10*3/uL (ref 150–450)
RBC: 4.04 x10E6/uL (ref 3.77–5.28)
RDW: 11.3 % — ABNORMAL LOW (ref 11.7–15.4)
WBC: 10.1 10*3/uL (ref 3.4–10.8)

## 2023-04-05 LAB — GLUCOSE TOLERANCE, 2 HOURS W/ 1HR
Glucose, 1 hour: 158 mg/dL (ref 70–179)
Glucose, 2 hour: 97 mg/dL (ref 70–152)
Glucose, Fasting: 77 mg/dL (ref 70–91)

## 2023-04-05 LAB — HIV ANTIBODY (ROUTINE TESTING W REFLEX): HIV Screen 4th Generation wRfx: NONREACTIVE

## 2023-04-05 LAB — RPR: RPR Ser Ql: NONREACTIVE

## 2023-04-07 ENCOUNTER — Encounter: Payer: Self-pay | Admitting: Obstetrics and Gynecology

## 2023-04-10 ENCOUNTER — Ambulatory Visit: Payer: Self-pay

## 2023-04-11 ENCOUNTER — Encounter: Payer: Self-pay | Admitting: "Endocrinology

## 2023-04-17 ENCOUNTER — Ambulatory Visit: Payer: BC Managed Care – PPO | Admitting: Obstetrics and Gynecology

## 2023-04-17 VITALS — BP 106/71 | HR 106 | Wt 120.0 lb

## 2023-04-17 DIAGNOSIS — Z3A30 30 weeks gestation of pregnancy: Secondary | ICD-10-CM | POA: Diagnosis not present

## 2023-04-17 DIAGNOSIS — Z23 Encounter for immunization: Secondary | ICD-10-CM

## 2023-04-17 DIAGNOSIS — E039 Hypothyroidism, unspecified: Secondary | ICD-10-CM

## 2023-04-17 DIAGNOSIS — O43192 Other malformation of placenta, second trimester: Secondary | ICD-10-CM

## 2023-04-17 DIAGNOSIS — Z3483 Encounter for supervision of other normal pregnancy, third trimester: Secondary | ICD-10-CM

## 2023-04-17 DIAGNOSIS — O09813 Supervision of pregnancy resulting from assisted reproductive technology, third trimester: Secondary | ICD-10-CM | POA: Diagnosis not present

## 2023-04-17 NOTE — Progress Notes (Signed)
   PRENATAL VISIT NOTE  Subjective:  Renee Jacobs is a 33 y.o. G2P0010 at [redacted]w[redacted]d being seen today for ongoing prenatal care.  She is currently monitored for the following issues for this high-risk pregnancy and has Supervision of normal pregnancy; Pregnancy resulting from in vitro fertilization; Hypothyroidism; and Marginal insertion of umbilical cord affecting management of mother in second trimester on their problem list.  Patient reports no complaints.  Contractions: Not present. Vag. Bleeding: None.  Movement: Present. Denies leaking of fluid.   The following portions of the patient's history were reviewed and updated as appropriate: allergies, current medications, past family history, past medical history, past social history, past surgical history and problem list.   Objective:   Vitals:   04/17/23 0835  BP: 106/71  Pulse: (!) 106  Weight: 120 lb (54.4 kg)    Fetal Status: Fetal Heart Rate (bpm): 145 Fundal Height: 28 cm Movement: Present     General:  Alert, oriented and cooperative. Patient is in no acute distress.  Skin: Skin is warm and dry. No rash noted.   Cardiovascular: Normal heart rate noted  Respiratory: Normal respiratory effort, no problems with respiration noted  Abdomen: Soft, gravid, appropriate for gestational age.  Pain/Pressure: Absent      Assessment and Plan:  Pregnancy: G2P0010 at [redacted]w[redacted]d 1. Encounter for supervision of other normal pregnancy in third trimester (Primary) 2. [redacted] weeks gestation of pregnancy Tdap today Discussed childbirth classes and cone healthy baby website. Encouraged continued physical activity with prenatal yoga and walking  3. Pregnancy resulting from in vitro fertilization in third trimester ldASA Weekly antenatal testing at 36 weeks  4. Hypothyroidism, unspecified type Synthroid M-Sa, skips sunday TSH 1.35 on 1/16. Plan to recheck next visit F/w endocrine, last saw 1/22. Would like to stop following with endo and have [place]  manage synthroid for remainder of pregnancy due cost of appointments  5. Marginal insertion of umbilical cord affecting management of mother in second trimester Follow up US originally scheduled 2/20, but cancelled due to snow. Rescheduled for 3/6 No e/o vasa previa  Preterm labor symptoms and general obstetric precautions including but not limited to vaginal bleeding, contractions, leaking of fluid and fetal movement were reviewed in detail with the patient.  Please refer to After Visit Summary for other counseling recommendations.   Return in about 2 weeks (around 05/01/2023) for return OB at 32 weeks.  Future Appointments  Date Time Provider Department Center  04/24/2023  3:30 PM [facility] US6 [facility]  05/01/2023  8:30 AM [name], MD [facility]  05/15/2023  8:30 AM [name], MD [facility]    Lennart Pall, MD

## 2023-04-24 ENCOUNTER — Ambulatory Visit: Payer: BC Managed Care – PPO | Attending: Obstetrics and Gynecology

## 2023-04-24 ENCOUNTER — Other Ambulatory Visit: Payer: Self-pay | Admitting: *Deleted

## 2023-04-24 DIAGNOSIS — O09813 Supervision of pregnancy resulting from assisted reproductive technology, third trimester: Secondary | ICD-10-CM

## 2023-04-24 DIAGNOSIS — Z362 Encounter for other antenatal screening follow-up: Secondary | ICD-10-CM | POA: Insufficient documentation

## 2023-04-24 DIAGNOSIS — Z3A31 31 weeks gestation of pregnancy: Secondary | ICD-10-CM

## 2023-04-24 DIAGNOSIS — E039 Hypothyroidism, unspecified: Secondary | ICD-10-CM

## 2023-04-24 DIAGNOSIS — O99283 Endocrine, nutritional and metabolic diseases complicating pregnancy, third trimester: Secondary | ICD-10-CM | POA: Diagnosis not present

## 2023-04-24 DIAGNOSIS — O43199 Other malformation of placenta, unspecified trimester: Secondary | ICD-10-CM

## 2023-04-24 DIAGNOSIS — O99280 Endocrine, nutritional and metabolic diseases complicating pregnancy, unspecified trimester: Secondary | ICD-10-CM

## 2023-04-30 NOTE — Progress Notes (Unsigned)
   PRENATAL VISIT NOTE  Subjective:  Renee Jacobs is a 33 y.o. G2P0010 at [redacted]w[redacted]d being seen today for ongoing prenatal care.  She is currently monitored for the following issues for this high-risk pregnancy and has Supervision of normal pregnancy; Pregnancy resulting from in vitro fertilization; Hypothyroidism; and Marginal insertion of umbilical cord affecting management of mother in second trimester on their problem list.  Patient reports {[id number]}.   .  .   . Denies leaking of fluid.   The following portions of the patient's history were reviewed and updated as appropriate: allergies, current medications, past family history, past medical history, past social history, past surgical history and problem list.   Objective:  There were no vitals filed for this visit.  Fetal Status:           General:  Alert, oriented and cooperative. Patient is in no acute distress.  Skin: Skin is warm and dry. No rash noted.   Cardiovascular: Normal heart rate noted  Respiratory: Normal respiratory effort, no problems with respiration noted  Abdomen: Soft, gravid, appropriate for gestational age.        Pelvic: Cervical exam deferred        Extremities: Normal range of motion.     Mental Status: Normal mood and affect. Normal behavior. Normal judgment and thought content.   Assessment and Plan:  Pregnancy: G2P0010 at [redacted]w[redacted]d 1. Hypothyroidism, unspecified type (Primary) TSH check today Synthroid M-Sa, skips Sunday.  Discussed MOC: ***  2. Encounter for supervision of other normal pregnancy in third trimester Routine PNC up to date  3. Pregnancy resulting from in vitro fertilization in third trimester Growth was nromal - 14%ile on 3/6.  Weekly BPP to start with MFM on 4/11.   4. Marginal insertion of umbilical cord affecting management of mother in second trimester No e/o vasa previa. Growth wnl.   5. Pregnancy with 32 completed weeks gestation   Preterm labor symptoms and general obstetric  precautions including but not limited to vaginal bleeding, contractions, leaking of fluid and fetal movement were reviewed in detail with the patient. Please refer to After Visit Summary for other counseling recommendations.   No follow-ups on file.  Future Appointments  Date Time Provider Department Center  05/01/2023  8:30 AM [name], MD [facility]  05/15/2023  8:30 AM [name], MD [facility]  05/23/2023  2:15 PM [facility] NURSE [facility]  05/23/2023  2:30 PM [facility] US1 [facility]  05/29/2023  8:30 AM [name], MD [facility]  05/30/2023  3:15 PM [facility] NURSE [facility]  05/30/2023  3:30 PM [facility] US4 [facility]  06/05/2023  8:30 AM Lennart Pall, MD [facility]  06/05/2023  3:15 PM [facility] NURSE [facility]  06/05/2023  3:30 PM [facility] US3 [facility]  06/12/2023  8:30 AM [name], MD [facility]  06/13/2023  3:15 PM [facility] NURSE [facility]  06/13/2023  3:30 PM [facility] US4 [facility]  06/19/2023  8:30 AM [name], MD [facility]    [name], MD

## 2023-05-01 ENCOUNTER — Ambulatory Visit (INDEPENDENT_AMBULATORY_CARE_PROVIDER_SITE_OTHER): Payer: BC Managed Care – PPO | Admitting: Obstetrics and Gynecology

## 2023-05-01 VITALS — BP 120/81 | HR 72 | Wt 122.0 lb

## 2023-05-01 DIAGNOSIS — Z3483 Encounter for supervision of other normal pregnancy, third trimester: Secondary | ICD-10-CM | POA: Diagnosis not present

## 2023-05-01 DIAGNOSIS — E039 Hypothyroidism, unspecified: Secondary | ICD-10-CM | POA: Diagnosis not present

## 2023-05-01 DIAGNOSIS — Z3A32 32 weeks gestation of pregnancy: Secondary | ICD-10-CM

## 2023-05-01 DIAGNOSIS — O43192 Other malformation of placenta, second trimester: Secondary | ICD-10-CM

## 2023-05-01 DIAGNOSIS — O09813 Supervision of pregnancy resulting from assisted reproductive technology, third trimester: Secondary | ICD-10-CM | POA: Diagnosis not present

## 2023-05-02 ENCOUNTER — Encounter: Payer: Self-pay | Admitting: Obstetrics and Gynecology

## 2023-05-02 LAB — TSH: TSH: 1.76 u[IU]/mL (ref 0.450–4.500)

## 2023-05-11 NOTE — Progress Notes (Unsigned)
   PRENATAL VISIT NOTE  Subjective:  Renee Jacobs is a 33 y.o. G2P0010 at [redacted]w[redacted]d being seen today for ongoing prenatal care.  She is currently monitored for the following issues for this low-risk pregnancy and has Supervision of normal pregnancy; Pregnancy resulting from in vitro fertilization; Hypothyroidism; and Marginal insertion of umbilical cord affecting management of mother in second trimester on their problem list.  Patient reports no complaints.  Contractions: Not present. Vag. Bleeding: None.  Movement: Present. Denies leaking of fluid.   The following portions of the patient's history were reviewed and updated as appropriate: allergies, current medications, past family history, past medical history, past social history, past surgical history and problem list.   Objective:   Vitals:   05/15/23 0824  BP: 111/78  Pulse: 81  Weight: 126 lb (57.2 kg)    Fetal Status: Fetal Heart Rate (bpm): 145 Fundal Height: 33 cm Movement: Present     General:  Alert, oriented and cooperative. Patient is in no acute distress.  Skin: Skin is warm and dry. No rash noted.   Cardiovascular: Normal heart rate noted  Respiratory: Normal respiratory effort, no problems with respiration noted  Abdomen: Soft, gravid, appropriate for gestational age.  Pain/Pressure: Absent     Pelvic: Cervical exam deferred        Extremities: Normal range of motion.  Edema: Trace  Mental Status: Normal mood and affect. Normal behavior. Normal judgment and thought content.   Assessment and Plan:  Pregnancy: G2P0010 at [redacted]w[redacted]d 1. Hypothyroidism, unspecified type (Primary) TSH wnl 3/13  2. Encounter for supervision of other normal pregnancy in third trimester Cultures next visit  3. Pregnancy resulting from in vitro fertilization in third trimester Probable 39-40w IOL  4. Marginal insertion of umbilical cord affecting management of mother in second trimester 3/6 growth 14%ile, normal AC, and normal AFI.  Next  growth 4/4.   5. Pregnancy with 34 completed weeks gestation   Preterm labor symptoms and general obstetric precautions including but not limited to vaginal bleeding, contractions, leaking of fluid and fetal movement were reviewed in detail with the patient. Please refer to After Visit Summary for other counseling recommendations.   Return in about 2 weeks (around 05/29/2023) for OB VISIT, MD or APP.  Future Appointments  Date Time Provider Department Center  05/23/2023  2:15 PM [facility] NURSE [facility]  05/23/2023  2:30 PM [facility] US1 [facility]  05/29/2023  8:30 AM [name], MD [facility]  05/30/2023  3:15 PM [facility] NURSE [facility]  05/30/2023  3:30 PM [facility] US4 [facility]  06/05/2023  8:30 AM Lennart Pall, MD [facility]  06/05/2023  3:15 PM [facility] NURSE [facility]  06/05/2023  3:30 PM [facility] US3 [facility]  06/12/2023  8:30 AM [name], MD [facility]  06/13/2023  3:15 PM [facility] NURSE [facility]  06/13/2023  3:30 PM [facility] US4 [facility]  06/19/2023  8:30 AM [name], MD [facility]    [name], MD

## 2023-05-15 ENCOUNTER — Ambulatory Visit (INDEPENDENT_AMBULATORY_CARE_PROVIDER_SITE_OTHER): Payer: BC Managed Care – PPO | Admitting: Obstetrics and Gynecology

## 2023-05-15 ENCOUNTER — Encounter: Payer: Self-pay | Admitting: Obstetrics and Gynecology

## 2023-05-15 VITALS — BP 111/78 | HR 81 | Wt 126.0 lb

## 2023-05-15 DIAGNOSIS — Z3483 Encounter for supervision of other normal pregnancy, third trimester: Secondary | ICD-10-CM | POA: Diagnosis not present

## 2023-05-15 DIAGNOSIS — Z3A34 34 weeks gestation of pregnancy: Secondary | ICD-10-CM

## 2023-05-15 DIAGNOSIS — O43192 Other malformation of placenta, second trimester: Secondary | ICD-10-CM

## 2023-05-15 DIAGNOSIS — E039 Hypothyroidism, unspecified: Secondary | ICD-10-CM | POA: Diagnosis not present

## 2023-05-15 DIAGNOSIS — O09813 Supervision of pregnancy resulting from assisted reproductive technology, third trimester: Secondary | ICD-10-CM | POA: Diagnosis not present

## 2023-05-23 ENCOUNTER — Ambulatory Visit (HOSPITAL_BASED_OUTPATIENT_CLINIC_OR_DEPARTMENT_OTHER): Admitting: Obstetrics and Gynecology

## 2023-05-23 ENCOUNTER — Ambulatory Visit: Attending: Obstetrics

## 2023-05-23 ENCOUNTER — Ambulatory Visit

## 2023-05-23 VITALS — BP 114/74

## 2023-05-23 DIAGNOSIS — E039 Hypothyroidism, unspecified: Secondary | ICD-10-CM | POA: Insufficient documentation

## 2023-05-23 DIAGNOSIS — O99280 Endocrine, nutritional and metabolic diseases complicating pregnancy, unspecified trimester: Secondary | ICD-10-CM | POA: Diagnosis not present

## 2023-05-23 DIAGNOSIS — O09513 Supervision of elderly primigravida, third trimester: Secondary | ICD-10-CM | POA: Diagnosis not present

## 2023-05-23 DIAGNOSIS — Z3483 Encounter for supervision of other normal pregnancy, third trimester: Secondary | ICD-10-CM | POA: Insufficient documentation

## 2023-05-23 DIAGNOSIS — O99283 Endocrine, nutritional and metabolic diseases complicating pregnancy, third trimester: Secondary | ICD-10-CM | POA: Insufficient documentation

## 2023-05-23 DIAGNOSIS — Z3A35 35 weeks gestation of pregnancy: Secondary | ICD-10-CM | POA: Insufficient documentation

## 2023-05-23 DIAGNOSIS — Z362 Encounter for other antenatal screening follow-up: Secondary | ICD-10-CM | POA: Insufficient documentation

## 2023-05-23 DIAGNOSIS — O43193 Other malformation of placenta, third trimester: Secondary | ICD-10-CM | POA: Insufficient documentation

## 2023-05-23 DIAGNOSIS — O43199 Other malformation of placenta, unspecified trimester: Secondary | ICD-10-CM | POA: Insufficient documentation

## 2023-05-23 NOTE — Progress Notes (Signed)
 Name: Daeja Helderman MRN: [id number]  IVF pregnancy. Marginal cord insertion. Patient return for fetal growth assessment and antenatal testing.  Ultrasound Fetal growth is appropriate for gestational age.  Amniotic fluid is normal good fetal activity seen.  Antenatal testing is reassuring.  Cephalic presentation.  Placental cord insertion could not be evaluated on today's ultrasound because of fetal position.  Patient had questions about timing of delivery.  I reassured the patient of today's ultrasound findings.  In pregnancies conceived by assisted reproductive technology, we recommend delivery at [redacted] weeks gestation.  There is a small slightly increased risk of stillbirth and IVF pregnancies.  I reassured the patient that she does not have any other complications including diabetes or hypertension. She has hypothyroidism and takes levothyroxine supplements.  Recent TSH levels are within normal range.  Recommendations -Continue weekly antenatal testing till delivery. -Delivery at [redacted] weeks gestation.  Consultation including face-to-face (more than 50%) counseling 10 minutes.

## 2023-05-28 NOTE — Progress Notes (Unsigned)
   PRENATAL VISIT NOTE  Subjective:  Renee Jacobs is a 33 y.o. G2P0010 at [redacted]w[redacted]d being seen today for ongoing prenatal care.  She is currently monitored for the following issues for this low-risk pregnancy and has Supervision of normal pregnancy; Pregnancy resulting from in vitro fertilization; Hypothyroidism; and Marginal insertion of umbilical cord affecting management of mother in second trimester on their problem list.  Patient reports {[id number]}.   .  .   . Denies leaking of fluid.   The following portions of the patient's history were reviewed and updated as appropriate: allergies, current medications, past family history, past medical history, past social history, past surgical history and problem list.   Objective:  There were no vitals filed for this visit.  Fetal Status:           General:  Alert, oriented and cooperative. Patient is in no acute distress.  Skin: Skin is warm and dry. No rash noted.   Cardiovascular: Normal heart rate noted  Respiratory: Normal respiratory effort, no problems with respiration noted  Abdomen: Soft, gravid, appropriate for gestational age.        Pelvic: Cervical exam performed in the presence of a chaperone        Extremities: Normal range of motion.     Mental Status: Normal mood and affect. Normal behavior. Normal judgment and thought content.   Assessment and Plan:  Pregnancy: G2P0010 at [redacted]w[redacted]d 1. Hypothyroidism, unspecified type (Primary) TSH normal 3 weeks ago.   2. Encounter for supervision of other normal pregnancy in third trimester Cultures today   3. Pregnancy resulting from in vitro fertilization in third trimester  4. Marginal insertion of umbilical cord affecting management of mother in second trimester Next growth is on 4/11.   5. Pregnancy with 36 completed weeks gestation   Preterm labor symptoms and general obstetric precautions including but not limited to vaginal bleeding, contractions, leaking of fluid and fetal  movement were reviewed in detail with the patient. Please refer to After Visit Summary for other counseling recommendations.   No follow-ups on file.  Future Appointments  Date Time Provider Department Center  05/29/2023  8:30 AM [name], MD [facility]  05/30/2023  3:00 PM [facility] PROVIDER 1 [facility]  05/30/2023  3:30 PM [facility] US4 [facility]  06/05/2023  8:30 AM Lennart Pall, MD [facility]  06/05/2023  3:00 PM [facility] NURSE [facility]  06/05/2023  3:15 PM [facility] NST [facility]  06/12/2023  8:30 AM [name], MD [facility]  06/13/2023  3:00 PM [facility] PROVIDER 1 [facility]  06/13/2023  3:30 PM [facility] US4 [facility]  06/19/2023  8:30 AM [name], MD [facility]    [name], MD

## 2023-05-29 ENCOUNTER — Other Ambulatory Visit (HOSPITAL_COMMUNITY)
Admission: RE | Admit: 2023-05-29 | Discharge: 2023-05-29 | Disposition: A | Discharge status: Home / Self Care | Source: Ambulatory Visit | Attending: Obstetrics and Gynecology | Admitting: Obstetrics and Gynecology

## 2023-05-29 ENCOUNTER — Encounter: Payer: Self-pay | Admitting: Obstetrics and Gynecology

## 2023-05-29 ENCOUNTER — Ambulatory Visit (INDEPENDENT_AMBULATORY_CARE_PROVIDER_SITE_OTHER): Payer: BC Managed Care – PPO | Admitting: Obstetrics and Gynecology

## 2023-05-29 VITALS — BP 131/88 | HR 69 | Wt 127.0 lb

## 2023-05-29 DIAGNOSIS — E039 Hypothyroidism, unspecified: Secondary | ICD-10-CM

## 2023-05-29 DIAGNOSIS — Z3483 Encounter for supervision of other normal pregnancy, third trimester: Secondary | ICD-10-CM | POA: Diagnosis not present

## 2023-05-29 DIAGNOSIS — O43192 Other malformation of placenta, second trimester: Secondary | ICD-10-CM | POA: Diagnosis not present

## 2023-05-29 DIAGNOSIS — O09813 Supervision of pregnancy resulting from assisted reproductive technology, third trimester: Secondary | ICD-10-CM

## 2023-05-29 DIAGNOSIS — Z3A36 36 weeks gestation of pregnancy: Secondary | ICD-10-CM

## 2023-05-29 NOTE — Progress Notes (Signed)
ROB- doing well.

## 2023-05-30 ENCOUNTER — Ambulatory Visit

## 2023-05-30 ENCOUNTER — Other Ambulatory Visit

## 2023-05-30 ENCOUNTER — Ambulatory Visit (HOSPITAL_BASED_OUTPATIENT_CLINIC_OR_DEPARTMENT_OTHER): Admitting: *Deleted

## 2023-05-30 ENCOUNTER — Encounter: Payer: Self-pay | Admitting: Obstetrics and Gynecology

## 2023-05-30 ENCOUNTER — Ambulatory Visit: Attending: Obstetrics and Gynecology | Admitting: Obstetrics

## 2023-05-30 VITALS — BP 112/84

## 2023-05-30 DIAGNOSIS — O99283 Endocrine, nutritional and metabolic diseases complicating pregnancy, third trimester: Secondary | ICD-10-CM

## 2023-05-30 DIAGNOSIS — Z3A36 36 weeks gestation of pregnancy: Secondary | ICD-10-CM

## 2023-05-30 DIAGNOSIS — E039 Hypothyroidism, unspecified: Secondary | ICD-10-CM

## 2023-05-30 DIAGNOSIS — O43193 Other malformation of placenta, third trimester: Secondary | ICD-10-CM | POA: Diagnosis not present

## 2023-05-30 DIAGNOSIS — Z3483 Encounter for supervision of other normal pregnancy, third trimester: Secondary | ICD-10-CM

## 2023-05-30 DIAGNOSIS — O09813 Supervision of pregnancy resulting from assisted reproductive technology, third trimester: Secondary | ICD-10-CM | POA: Diagnosis not present

## 2023-05-30 DIAGNOSIS — O43192 Other malformation of placenta, second trimester: Secondary | ICD-10-CM

## 2023-05-30 LAB — CERVICOVAGINAL ANCILLARY ONLY
Chlamydia: NEGATIVE
Comment: NEGATIVE
Comment: NORMAL
Neisseria Gonorrhea: NEGATIVE

## 2023-05-30 NOTE — Progress Notes (Signed)
 MFM Consult Note  Renee Jacobs is currently at 36 weeks and 5 days.  She has been followed due to an IVF pregnancy and hypothyroidism.    She had a growth scan performed 1 week ago that showed an EFW of 5 pounds 5 ounces (17th percentile).  There was normal amniotic fluid noted with a total AFI of 10.66 cm.    She had a reactive NST today.    Due to the IVF pregnancy, we will continue to follow her with weekly fetal testing until delivery.    As the fetal growth is within normal limits, delivery for IVF pregnancies is recommended at around 39 weeks.  The patient will discuss scheduling an induction with you at her next prenatal visit.    She will return to our office in 1 week for an NST.    The patient stated that all of her questions were answered today.    A total of 10 minutes was spent counseling and coordinating the care for this patient.  Greater than 50% of the time was spent in direct face-to-face contact.

## 2023-05-30 NOTE — Procedures (Signed)
 Renee Jacobs [birth year] [redacted]w[redacted]d  Fetus A Non-Stress Test Interpretation for 05/30/23  Indication:  MCI  Fetal Heart Rate A Mode: External Baseline Rate (A): 130 bpm Variability: Moderate Accelerations: 15 x 15 Decelerations: None Multiple birth?: No  Uterine Activity Mode: Palpation, Toco Contraction Frequency (min): none Resting Tone Palpated: Relaxed  Interpretation (Fetal Testing) Nonstress Test Interpretation: Reactive Overall Impression: Reassuring for gestational age Comments: Dr. Parke Poisson reviewed tracing

## 2023-06-02 LAB — CULTURE, BETA STREP (GROUP B ONLY): Strep Gp B Culture: NEGATIVE

## 2023-06-04 ENCOUNTER — Telehealth (HOSPITAL_COMMUNITY): Payer: Self-pay | Admitting: *Deleted

## 2023-06-04 ENCOUNTER — Encounter (HOSPITAL_COMMUNITY): Payer: Self-pay

## 2023-06-04 NOTE — Telephone Encounter (Signed)
 Preadmission screen

## 2023-06-05 ENCOUNTER — Telehealth (HOSPITAL_COMMUNITY): Payer: Self-pay | Admitting: *Deleted

## 2023-06-05 ENCOUNTER — Ambulatory Visit (INDEPENDENT_AMBULATORY_CARE_PROVIDER_SITE_OTHER): Payer: BC Managed Care – PPO | Admitting: Obstetrics and Gynecology

## 2023-06-05 ENCOUNTER — Other Ambulatory Visit

## 2023-06-05 ENCOUNTER — Encounter (HOSPITAL_COMMUNITY): Payer: Self-pay | Admitting: *Deleted

## 2023-06-05 ENCOUNTER — Ambulatory Visit (HOSPITAL_BASED_OUTPATIENT_CLINIC_OR_DEPARTMENT_OTHER): Admitting: Maternal & Fetal Medicine

## 2023-06-05 ENCOUNTER — Ambulatory Visit

## 2023-06-05 ENCOUNTER — Ambulatory Visit: Attending: Obstetrics and Gynecology | Admitting: *Deleted

## 2023-06-05 VITALS — BP 120/90 | HR 75

## 2023-06-05 VITALS — BP 126/87 | HR 70

## 2023-06-05 VITALS — BP 126/87 | HR 70 | Wt 131.0 lb

## 2023-06-05 DIAGNOSIS — O09813 Supervision of pregnancy resulting from assisted reproductive technology, third trimester: Secondary | ICD-10-CM | POA: Diagnosis not present

## 2023-06-05 DIAGNOSIS — Z3A37 37 weeks gestation of pregnancy: Secondary | ICD-10-CM

## 2023-06-05 DIAGNOSIS — O43192 Other malformation of placenta, second trimester: Secondary | ICD-10-CM

## 2023-06-05 DIAGNOSIS — O43193 Other malformation of placenta, third trimester: Secondary | ICD-10-CM | POA: Diagnosis not present

## 2023-06-05 DIAGNOSIS — Z3483 Encounter for supervision of other normal pregnancy, third trimester: Secondary | ICD-10-CM | POA: Diagnosis not present

## 2023-06-05 DIAGNOSIS — E039 Hypothyroidism, unspecified: Secondary | ICD-10-CM

## 2023-06-05 NOTE — Progress Notes (Signed)
   PRENATAL VISIT NOTE  Subjective:  Renee Jacobs is a 33 y.o. G2P0010 at [redacted]w[redacted]d being seen today for ongoing prenatal care.  She is currently monitored for the following issues for this high-risk pregnancy and has Supervision of normal pregnancy; Pregnancy resulting from in vitro fertilization; Hypothyroidism; and Marginal insertion of umbilical cord affecting management of mother in second trimester on their problem list.  Patient reports  doing well overall .  Contractions: Not present. Vag. Bleeding: None.  Movement: Present. Denies leaking of fluid.   The following portions of the patient's history were reviewed and updated as appropriate: allergies, current medications, past family history, past medical history, past social history, past surgical history and problem list.   Objective:   Vitals:   06/05/23 0818  BP: 126/87  Pulse: 70  Weight: 131 lb (59.4 kg)    Fetal Status: Fetal Heart Rate (bpm): 143   Movement: Present     General:  Alert, oriented and cooperative. Patient is in no acute distress.  Skin: Skin is warm and dry. No rash noted.   Cardiovascular: Normal heart rate noted  Respiratory: Normal respiratory effort, no problems with respiration noted  Abdomen: Soft, gravid, appropriate for gestational age.  Pain/Pressure: Absent      Assessment and Plan:  Pregnancy: G2P0010 at [redacted]w[redacted]d 1. Encounter for supervision of other normal pregnancy in third trimester (Primary) 2. [redacted] weeks gestation of pregnancy SCE cl/70/-3 - performed in presence of chaperone. Counseled to expect some spotting, cramping after check and when to present to MAU Reviewed cadence of IOL, that timing can be unpredictable in terms of when she will deliver, timing of epidural etc  3. Pregnancy resulting from in vitro fertilization in third trimester Weekly NST w/ MFM Growth 4/4 @35 /5 cephalic 2419g (17%), AC 14% IOL scheduled 4/28  4. Marginal insertion of umbilical cord affecting management of  mother in second trimester Growth as above  5. Hypothyroidism, unspecified type Cont synthroid, TSH 1.76 on 3/13  Please refer to After Visit Summary for other counseling recommendations.   Return in about 1 week (around 06/12/2023) for return OB at 38 weeks.  Future Appointments  Date Time Provider Department Center  06/05/2023  3:00 PM [facility] NURSE [facility]  06/05/2023  3:15 PM [facility] NST [facility]  06/12/2023  8:30 AM Lacey Pian, MD [facility]  06/13/2023  3:00 PM [facility] PROVIDER 1 [facility]  06/13/2023  3:30 PM [facility] US4 [facility]  06/16/2023  6:30 AM [facility]-LD SCHED ROOM [facility] None  06/19/2023  8:30 AM Lacey Pian, MD [facility]   Izell Marsh, MD

## 2023-06-05 NOTE — Procedures (Signed)
 Renee Jacobs 1990/10/19 [redacted]w[redacted]d  Fetus A Non-Stress Test Interpretation for 06/05/23  Indication:  MCI  Fetal Heart Rate A Mode: External Baseline Rate (A): 135 bpm Variability: Moderate Accelerations: 15 x 15 Decelerations: None Multiple birth?: No  Uterine Activity Mode: Toco Contraction Frequency (min): 2 UC's Contraction Duration (sec): 180 Resting Tone Palpated: Relaxed  Interpretation (Fetal Testing) Nonstress Test Interpretation: Reactive Comments: Tracing reviewd by Dr. Nolan Battle

## 2023-06-05 NOTE — Telephone Encounter (Signed)
 Preadmission screen

## 2023-06-06 ENCOUNTER — Ambulatory Visit

## 2023-06-06 NOTE — Progress Notes (Signed)
 Patient canceled appointment

## 2023-06-07 ENCOUNTER — Encounter (HOSPITAL_COMMUNITY): Payer: Self-pay | Admitting: Obstetrics & Gynecology

## 2023-06-07 ENCOUNTER — Inpatient Hospital Stay (HOSPITAL_COMMUNITY)
Admission: AD | Admit: 2023-06-07 | Discharge: 2023-06-07 | Disposition: A | Discharge status: Home / Self Care | Attending: Obstetrics & Gynecology | Admitting: Obstetrics & Gynecology

## 2023-06-07 DIAGNOSIS — Z3A37 37 weeks gestation of pregnancy: Secondary | ICD-10-CM | POA: Diagnosis not present

## 2023-06-07 DIAGNOSIS — O1203 Gestational edema, third trimester: Secondary | ICD-10-CM | POA: Diagnosis not present

## 2023-06-07 DIAGNOSIS — R030 Elevated blood-pressure reading, without diagnosis of hypertension: Secondary | ICD-10-CM | POA: Diagnosis not present

## 2023-06-07 DIAGNOSIS — Z3689 Encounter for other specified antenatal screening: Secondary | ICD-10-CM

## 2023-06-07 LAB — COMPREHENSIVE METABOLIC PANEL WITH GFR
ALT: 26 U/L (ref 0–44)
AST: 28 U/L (ref 15–41)
Albumin: 2.6 g/dL — ABNORMAL LOW (ref 3.5–5.0)
Alkaline Phosphatase: 122 U/L (ref 38–126)
Anion gap: 8 (ref 5–15)
BUN: 9 mg/dL (ref 6–20)
CO2: 21 mmol/L — ABNORMAL LOW (ref 22–32)
Calcium: 9 mg/dL (ref 8.9–10.3)
Chloride: 103 mmol/L (ref 98–111)
Creatinine, Ser: 0.63 mg/dL (ref 0.44–1.00)
GFR, Estimated: >60 mL/min (ref >60)
Glucose, Bld: 80 mg/dL (ref 70–99)
Potassium: 4 mmol/L (ref 3.5–5.1)
Sodium: 132 mmol/L — ABNORMAL LOW (ref 135–145)
Total Bilirubin: 0.6 mg/dL (ref 0.0–1.2)
Total Protein: 5.9 g/dL — ABNORMAL LOW (ref 6.5–8.1)

## 2023-06-07 LAB — CBC
HCT: 41.4 % (ref 36.0–46.0)
Hemoglobin: 14.2 g/dL (ref 12.0–15.0)
MCH: 31.9 pg (ref 26.0–34.0)
MCHC: 34.3 g/dL (ref 30.0–36.0)
MCV: 93 fL (ref 80.0–100.0)
Platelets: 182 10*3/uL (ref 150–400)
RBC: 4.45 MIL/uL (ref 3.87–5.11)
RDW: 11.9 % (ref 11.5–15.5)
WBC: 8.7 10*3/uL (ref 4.0–10.5)
nRBC: 0 % (ref 0.0–0.2)

## 2023-06-07 LAB — PROTEIN / CREATININE RATIO, URINE
Creatinine, Urine: 153 mg/dL
Protein Creatinine Ratio: 0.1 mg/mg{creat} (ref 0.00–0.15)
Total Protein, Urine: 16 mg/dL

## 2023-06-07 NOTE — MAU Note (Signed)
.  Renee Jacobs is a 34 y.o. at 107w6d here in MAU reporting: this am she had some facial swelling and b/p was 130's/90's x 2. Seen on Thursday and ? Elevated b/p then. Reports decreased fetal movement but is feeling movement. Denies bleeding, ROM or contractions. Denies headache or blurred vision. Denies RUQ pain.  Onset of complaint: this am Pain score: 0/10 Vitals:   06/07/23 1109 06/07/23 1110  BP: 129/89   Pulse: 62   Resp: 16   Temp: 97.8 F (36.6 C)   SpO2: 98% 98%     FHT: 128  Lab orders placed from triage:

## 2023-06-07 NOTE — MAU Provider Note (Signed)
 History     CSN: [id number]  Arrival date and time: 06/07/23 1032   Event Date/Time   First Provider Initiated Contact with Patient 06/07/23 1122      Chief Complaint  Patient presents with  . Hypertension  . Facial Swelling   HPI Renee Jacobs is a 33 y.o. year old G64P0010 female at [redacted]w[redacted]d weeks gestation who presents to MAU reporting both of her eyelids were swollen nearly shut when she woke up this morning; she thinks they are still somewhat swollen. She reports her BPA was 140/97, 139/97 and 130/95 (pictures from phone of BP monitor shown to this CNM). She was seen at her OB on Thursday 06/05/2023 and her BP was ?elevated. She reports she is feeling some FM, but it is DFM. She denies any signs of H/A, dizziness, blurry vision, RUQ or epigastric pain, VB, or LOF. She receives [facility]; next appt is 06/12/2023. Her pregnancy is complicated by: IVF pregnancy and possible marginal cord insertion. She is scheduled for IOL on 06/16/2023   OB History     Gravida  2   Para      Term      Preterm      AB  1   Living         SAB  1   IAB      Ectopic      Multiple      Live Births              Past Medical History:  Diagnosis Date  . Anemia   . Hypothyroidism   . Infertility management     Past Surgical History:  Procedure Laterality Date  . MOUTH SURGERY  04/2020  . OTHER SURGICAL HISTORY     surg/procedures associated with IVF (egg retrieval, embryo transfer, etc)    Family History  Problem Relation Age of Onset  . Healthy Mother   . Ovarian cysts Mother   . Hyperlipidemia Father   . Hypertension Father     Social History   Tobacco Use  . Smoking status: Never  . Smokeless tobacco: Never  Vaping Use  . Vaping status: Never Used  Substance Use Topics  . Alcohol use: Not Currently    Comment: occ  . Drug use: Never    Allergies: No Known Allergies  Medications Prior to Admission  Medication Sig Dispense Refill Last  Dose/Taking  . aspirin 81 MG chewable tablet Chew 81 mg by mouth daily.   06/06/2023  . levothyroxine  (SYNTHROID ) 50 MCG tablet Take 1 tablet (50 mcg total) by mouth daily. 90 tablet 3 06/07/2023  . metoCLOPramide  (REGLAN ) 10 MG tablet Take 1 tablet (10 mg total) by mouth 4 (four) times daily as needed for nausea or vomiting. 30 tablet 2 06/06/2023  . Omega-3 Fatty Acids (FISH OIL) 1000 MG CAPS Take 1,000 mg by mouth daily.   06/06/2023  . ondansetron  (ZOFRAN -ODT) 8 MG disintegrating tablet Take 0.5 tablets (4 mg total) by mouth every 8 (eight) hours as needed for nausea. 60 tablet 3 06/06/2023  . Prenatal Vit-Fe Fumarate-FA (PRENATAL VITAMIN PO) Take by mouth.   06/06/2023    Review of Systems  Constitutional: Negative.   HENT:  Positive for facial swelling ("eyelids swollen when i woke up and some now").   Eyes: Negative.   Respiratory: Negative.    Cardiovascular:        Elevated BP x 3 @ home this morning. Feet swollen, R>L  Gastrointestinal: Negative.  Genitourinary:        DFM   Physical Exam   Patient Vitals for the past 24 hrs:  BP Temp Temp src Pulse Resp SpO2 Height Weight  06/07/23 1305 -- -- -- -- -- 99 % -- --  06/07/23 1300 117/87 -- -- 66 -- 98 % -- --  06/07/23 1255 -- -- -- -- -- 98 % -- --  06/07/23 1250 -- -- -- -- -- 99 % -- --  06/07/23 1245 114/83 -- -- 67 -- 99 % -- --  06/07/23 1240 -- -- -- -- -- 98 % -- --  06/07/23 1235 -- -- -- -- -- 98 % -- --  06/07/23 1230 119/85 -- -- 66 -- 99 % -- --  06/07/23 1225 -- -- -- -- -- 99 % -- --  06/07/23 1220 -- -- -- -- -- 99 % -- --  06/07/23 1215 127/89 -- -- 71 -- 99 % -- --  06/07/23 1210 -- -- -- -- -- 99 % -- --  06/07/23 1205 -- -- -- -- -- 99 % -- --  06/07/23 1200 119/84 -- -- (!) 58 -- 99 % -- --  06/07/23 1155 -- -- -- -- -- 99 % -- --  06/07/23 1150 -- -- -- -- -- 99 % -- --  06/07/23 1146 118/77 -- -- (!) 58 -- -- -- --  06/07/23 1145 -- -- -- -- -- 99 % -- --  06/07/23 1140 -- -- -- -- -- 98 % -- --   06/07/23 1135 -- -- -- -- -- 99 % -- --  06/07/23 1130 121/87 -- -- (!) 59 -- 99 % -- --  06/07/23 1125 -- -- -- -- -- 99 % -- --  06/07/23 1120 -- -- -- -- -- 99 % -- --  06/07/23 1115 (!) 123/92 -- -- 64 -- 99 % -- --  06/07/23 1110 -- -- -- -- -- 98 % -- --  06/07/23 1109 129/89 97.8 F (36.6 C) Oral 62 16 98 % 5\' 4"  (1.626 m) 60 kg    Physical Exam Vitals and nursing note reviewed.  Constitutional:      Appearance: Normal appearance. She is normal weight.  Cardiovascular:     Rate and Rhythm: Normal rate and regular rhythm.     Pulses: Normal pulses.     Heart sounds: Normal heart sounds.  Pulmonary:     Effort: Pulmonary effort is normal.     Breath sounds: Normal breath sounds.  Abdominal:     General: Bowel sounds are normal.     Palpations: Abdomen is soft.  Genitourinary:    Comments: deferred Musculoskeletal:        General: Normal range of motion.  Skin:    General: Skin is warm and dry.  Neurological:     Mental Status: She is alert and oriented to person, place, and time.  Psychiatric:        Mood and Affect: Mood normal.        Behavior: Behavior normal.        Thought Content: Thought content normal.        Judgment: Judgment normal.   REACTIVE NST - FHR: 135 bpm / moderate variability / accels present / decels absent / TOCO: Occ UCs and UI   Reassessment @ 1200: Patient eating sandwich. Spouse supportive at bedside.  MAU Course  Procedures  MDM CCUA CBC CMP P/C Ratio Serial BP's   Results for orders placed or performed  during the hospital encounter of 06/07/23 (from the past 24 hours)  Protein / creatinine ratio, urine     Status: None   Collection Time: 06/07/23 11:13 AM  Result Value Ref Range   Creatinine, Urine 153 mg/dL   Total Protein, Urine 16 mg/dL   Protein Creatinine Ratio 0.10 0.00 - 0.15 mg/mg[Cre]  CBC     Status: None   Collection Time: 06/07/23 11:39 AM  Result Value Ref Range   WBC 8.7 4.0 - 10.5 K/uL   RBC 4.45 3.87 -  5.11 MIL/uL   Hemoglobin 14.2 12.0 - 15.0 g/dL   HCT 78.2 95.6 - 21.3 %   MCV 93.0 80.0 - 100.0 fL   MCH 31.9 26.0 - 34.0 pg   MCHC 34.3 30.0 - 36.0 g/dL   RDW 08.6 57.8 - 46.9 %   Platelets 182 150 - 400 K/uL   nRBC 0.0 0.0 - 0.2 %  Comprehensive metabolic panel with GFR     Status: Abnormal   Collection Time: 06/07/23 11:39 AM  Result Value Ref Range   Sodium 132 (L) 135 - 145 mmol/L   Potassium 4.0 3.5 - 5.1 mmol/L   Chloride 103 98 - 111 mmol/L   CO2 21 (L) 22 - 32 mmol/L   Glucose, Bld 80 70 - 99 mg/dL   BUN 9 6 - 20 mg/dL   Creatinine, Ser 6.29 0.44 - 1.00 mg/dL   Calcium 9.0 8.9 - 52.8 mg/dL   Total Protein 5.9 (L) 6.5 - 8.1 g/dL   Albumin 2.6 (L) 3.5 - 5.0 g/dL   AST 28 15 - 41 U/L   ALT 26 0 - 44 U/L   Alkaline Phosphatase 122 38 - 126 U/L   Total Bilirubin 0.6 0.0 - 1.2 mg/dL   GFR, Estimated >41 >32 mL/min   Anion gap 8 5 - 15     Assessment and Plan  ***  Almond Army, CNM 06/07/2023, 11:22 AM

## 2023-06-12 ENCOUNTER — Ambulatory Visit (INDEPENDENT_AMBULATORY_CARE_PROVIDER_SITE_OTHER): Payer: BC Managed Care – PPO | Admitting: Obstetrics and Gynecology

## 2023-06-12 ENCOUNTER — Encounter (HOSPITAL_COMMUNITY): Payer: Self-pay | Admitting: Obstetrics and Gynecology

## 2023-06-12 ENCOUNTER — Inpatient Hospital Stay (HOSPITAL_COMMUNITY)
Admission: RE | Admit: 2023-06-12 | Discharge: 2023-06-16 | DRG: 787 | Disposition: A | Discharge status: Home / Self Care | Attending: Obstetrics and Gynecology | Admitting: Obstetrics and Gynecology

## 2023-06-12 ENCOUNTER — Other Ambulatory Visit: Payer: Self-pay

## 2023-06-12 VITALS — BP 124/85 | HR 65 | Wt 131.0 lb

## 2023-06-12 DIAGNOSIS — Z3A00 Weeks of gestation of pregnancy not specified: Secondary | ICD-10-CM | POA: Diagnosis not present

## 2023-06-12 DIAGNOSIS — E039 Hypothyroidism, unspecified: Secondary | ICD-10-CM | POA: Diagnosis present

## 2023-06-12 DIAGNOSIS — O9962 Diseases of the digestive system complicating childbirth: Secondary | ICD-10-CM | POA: Diagnosis not present

## 2023-06-12 DIAGNOSIS — O43193 Other malformation of placenta, third trimester: Secondary | ICD-10-CM | POA: Diagnosis not present

## 2023-06-12 DIAGNOSIS — D252 Subserosal leiomyoma of uterus: Secondary | ICD-10-CM | POA: Diagnosis present

## 2023-06-12 DIAGNOSIS — Z79890 Hormone replacement therapy (postmenopausal): Secondary | ICD-10-CM | POA: Diagnosis not present

## 2023-06-12 DIAGNOSIS — K219 Gastro-esophageal reflux disease without esophagitis: Secondary | ICD-10-CM | POA: Diagnosis present

## 2023-06-12 DIAGNOSIS — O99284 Endocrine, nutritional and metabolic diseases complicating childbirth: Secondary | ICD-10-CM | POA: Diagnosis not present

## 2023-06-12 DIAGNOSIS — Z3483 Encounter for supervision of other normal pregnancy, third trimester: Secondary | ICD-10-CM | POA: Diagnosis not present

## 2023-06-12 DIAGNOSIS — O3413 Maternal care for benign tumor of corpus uteri, third trimester: Secondary | ICD-10-CM | POA: Diagnosis not present

## 2023-06-12 DIAGNOSIS — Z7982 Long term (current) use of aspirin: Secondary | ICD-10-CM | POA: Diagnosis not present

## 2023-06-12 DIAGNOSIS — O9081 Anemia of the puerperium: Secondary | ICD-10-CM | POA: Diagnosis not present

## 2023-06-12 DIAGNOSIS — O133 Gestational [pregnancy-induced] hypertension without significant proteinuria, third trimester: Secondary | ICD-10-CM | POA: Diagnosis not present

## 2023-06-12 DIAGNOSIS — O09813 Supervision of pregnancy resulting from assisted reproductive technology, third trimester: Secondary | ICD-10-CM

## 2023-06-12 DIAGNOSIS — O134 Gestational [pregnancy-induced] hypertension without significant proteinuria, complicating childbirth: Secondary | ICD-10-CM | POA: Diagnosis not present

## 2023-06-12 DIAGNOSIS — O43192 Other malformation of placenta, second trimester: Secondary | ICD-10-CM | POA: Diagnosis not present

## 2023-06-12 DIAGNOSIS — Z3A38 38 weeks gestation of pregnancy: Secondary | ICD-10-CM | POA: Diagnosis not present

## 2023-06-12 DIAGNOSIS — Z3490 Encounter for supervision of normal pregnancy, unspecified, unspecified trimester: Principal | ICD-10-CM | POA: Diagnosis present

## 2023-06-12 DIAGNOSIS — D62 Acute posthemorrhagic anemia: Secondary | ICD-10-CM | POA: Diagnosis not present

## 2023-06-12 DIAGNOSIS — Z98891 History of uterine scar from previous surgery: Principal | ICD-10-CM

## 2023-06-12 DIAGNOSIS — O4423 Partial placenta previa NOS or without hemorrhage, third trimester: Secondary | ICD-10-CM | POA: Diagnosis not present

## 2023-06-12 DIAGNOSIS — Z8249 Family history of ischemic heart disease and other diseases of the circulatory system: Secondary | ICD-10-CM | POA: Diagnosis not present

## 2023-06-12 LAB — PROTEIN / CREATININE RATIO, URINE
Creatinine, Urine: 211 mg/dL
Protein Creatinine Ratio: 0.11 mg/mg{creat} (ref 0.00–0.15)
Total Protein, Urine: 23 mg/dL

## 2023-06-12 LAB — CBC
HCT: 41.5 % (ref 36.0–46.0)
Hemoglobin: 14.5 g/dL (ref 12.0–15.0)
MCH: 32.6 pg (ref 26.0–34.0)
MCHC: 34.9 g/dL (ref 30.0–36.0)
MCV: 93.3 fL (ref 80.0–100.0)
Platelets: 193 10*3/uL (ref 150–400)
RBC: 4.45 MIL/uL (ref 3.87–5.11)
RDW: 11.8 % (ref 11.5–15.5)
WBC: 8.7 10*3/uL (ref 4.0–10.5)
nRBC: 0 % (ref 0.0–0.2)

## 2023-06-12 LAB — COMPREHENSIVE METABOLIC PANEL WITH GFR
ALT: 40 U/L (ref 0–44)
AST: 37 U/L (ref 15–41)
Albumin: 2.7 g/dL — ABNORMAL LOW (ref 3.5–5.0)
Alkaline Phosphatase: 127 U/L — ABNORMAL HIGH (ref 38–126)
Anion gap: 9 (ref 5–15)
BUN: 10 mg/dL (ref 6–20)
CO2: 20 mmol/L — ABNORMAL LOW (ref 22–32)
Calcium: 9.2 mg/dL (ref 8.9–10.3)
Chloride: 107 mmol/L (ref 98–111)
Creatinine, Ser: 0.63 mg/dL (ref 0.44–1.00)
GFR, Estimated: >60 mL/min (ref >60)
Glucose, Bld: 108 mg/dL — ABNORMAL HIGH (ref 70–99)
Potassium: 4.2 mmol/L (ref 3.5–5.1)
Sodium: 136 mmol/L (ref 135–145)
Total Bilirubin: 0.5 mg/dL (ref 0.0–1.2)
Total Protein: 6.4 g/dL — ABNORMAL LOW (ref 6.5–8.1)

## 2023-06-12 LAB — TYPE AND SCREEN
ABO/RH(D): B POS
Antibody Screen: NEGATIVE

## 2023-06-12 MED ORDER — SOD CITRATE-CITRIC ACID 500-334 MG/5ML PO SOLN
30.0000 mL | ORAL | Status: DC | PRN
  Administered 2023-06-13: 30 mL via ORAL
  Filled 2023-06-12: qty 30

## 2023-06-12 MED ORDER — OXYCODONE-ACETAMINOPHEN 5-325 MG PO TABS: 1.0000 | ORAL_TABLET | ORAL | Status: DC | PRN

## 2023-06-12 MED ORDER — LACTATED RINGERS IV SOLN
500.0000 mL | INTRAVENOUS | Status: DC | PRN
  Administered 2023-06-13 (×2): 500 mL via INTRAVENOUS

## 2023-06-12 MED ORDER — LIDOCAINE HCL (PF) 1 % IJ SOLN: 30.0000 mL | INTRAMUSCULAR | Status: DC | PRN

## 2023-06-12 MED ORDER — FENTANYL CITRATE (PF) 100 MCG/2ML IJ SOLN
50.0000 ug | INTRAMUSCULAR | Status: DC | PRN
  Administered 2023-06-13: 50 ug via INTRAVENOUS
  Administered 2023-06-13 (×3): 100 ug via INTRAVENOUS
  Filled 2023-06-12 (×4): qty 2

## 2023-06-12 MED ORDER — ACETAMINOPHEN 325 MG PO TABS: 650.0000 mg | ORAL_TABLET | ORAL | Status: DC | PRN

## 2023-06-12 MED ORDER — OXYTOCIN BOLUS FROM INFUSION: 333.0000 mL | Freq: Once | INTRAVENOUS | Status: DC

## 2023-06-12 MED ORDER — OXYCODONE-ACETAMINOPHEN 5-325 MG PO TABS: 2.0000 | ORAL_TABLET | ORAL | Status: DC | PRN

## 2023-06-12 MED ORDER — LACTATED RINGERS IV SOLN: INTRAVENOUS | Status: DC

## 2023-06-12 MED ORDER — ONDANSETRON HCL 4 MG/2ML IJ SOLN
4.0000 mg | Freq: Four times a day (QID) | INTRAMUSCULAR | Status: DC | PRN
  Administered 2023-06-13: 4 mg via INTRAVENOUS
  Filled 2023-06-12: qty 2

## 2023-06-12 MED ORDER — HYDRALAZINE HCL 20 MG/ML IJ SOLN: 10.0000 mg | INTRAMUSCULAR | Status: DC | PRN

## 2023-06-12 MED ORDER — LABETALOL HCL 5 MG/ML IV SOLN: 20.0000 mg | INTRAVENOUS | Status: DC | PRN

## 2023-06-12 MED ORDER — OXYTOCIN-SODIUM CHLORIDE 30-0.9 UT/500ML-% IV SOLN: 2.5000 [IU]/h | INTRAVENOUS | Status: DC

## 2023-06-12 MED ORDER — LABETALOL HCL 5 MG/ML IV SOLN: 80.0000 mg | INTRAVENOUS | Status: DC | PRN

## 2023-06-12 MED ORDER — LABETALOL HCL 5 MG/ML IV SOLN: 40.0000 mg | INTRAVENOUS | Status: DC | PRN

## 2023-06-12 NOTE — Progress Notes (Signed)
   PRENATAL VISIT NOTE  Subjective:  Renee Jacobs is a 33 y.o. G2P0010 at [redacted]w[redacted]d being seen today for ongoing prenatal care.  She is currently monitored for the following issues for this low-risk pregnancy and has Supervision of normal pregnancy; Pregnancy resulting from in vitro fertilization; Hypothyroidism; and Marginal insertion of umbilical cord affecting management of mother in second trimester on their problem list.  Patient reports no complaints.  Contractions: Not present. Vag. Bleeding: None.  Movement: Present. Denies leaking of fluid.   The following portions of the patient's history were reviewed and updated as appropriate: allergies, current medications, past family history, past medical history, past social history, past surgical history and problem list.   Objective:   Vitals:   06/12/23 0829  BP: 124/85  Pulse: 65  Weight: 131 lb (59.4 kg)    Fetal Status: Fetal Heart Rate (bpm): 144   Movement: Present     General:  Alert, oriented and cooperative. Patient is in no acute distress.  Skin: Skin is warm and dry. No rash noted.   Cardiovascular: Normal heart rate noted  Respiratory: Normal respiratory effort, no problems with respiration noted  Abdomen: Soft, gravid, appropriate for gestational age.  Pain/Pressure: Absent     Pelvic: Cervical exam deferred Dilation: 1 Effacement (%): 60 Station: -1  Extremities: Normal range of motion.  Edema: None  Mental Status: Normal mood and affect. Normal behavior. Normal judgment and thought content.   Assessment and Plan:  Pregnancy: G2P0010 at [redacted]w[redacted]d 1. Pregnancy resulting from in vitro fertilization in third trimester (Primary) Scheduled for iol next week.   2. Marginal insertion of umbilical cord affecting management of mother in second trimester Growth wnl.   3. Encounter for supervision of other normal pregnancy in third trimester GBS neg  4. Hypothyroidism, unspecified type On Synthroid   5. Pregnancy with 38  completed weeks gestation   Term labor symptoms and general obstetric precautions including but not limited to vaginal bleeding, contractions, leaking of fluid and fetal movement were reviewed in detail with the patient. Please refer to After Visit Summary for other counseling recommendations.   No follow-ups on file.  Future Appointments  Date Time Provider Department Center  06/13/2023  3:00 PM [facility]  06/13/2023  3:30 PM [facility] US4 [facility]  06/16/2023  6:30 AM MC-LD SCHED ROOM [facility]IN[facility] None  07/23/2023  8:50 AM Lacey Pian, MD [facility]    Lacey Pian, MD

## 2023-06-12 NOTE — MAU Note (Signed)
.  Renee Jacobs is a 33 y.o. at [redacted]w[redacted]d here in MAU reporting HTN and h/a on R side of head earlier. H/A resolved  without medication. Pt took B/P at home which were 1748-126/[id number] [phone] [id number] 3086-578/46  LMP: n/a Onset of complaint: 1700 Pain score: 0 Vitals:   06/12/23 2022 06/12/23 2023  BP:  (!) 129/92  Pulse: 81   Resp: 17   Temp: (!) 97.5 F (36.4 C)   SpO2: 99%      FHT: 133  Lab orders placed from triage: none in Triage

## 2023-06-12 NOTE — H&P (Addendum)
 OBSTETRIC ADMISSION HISTORY AND PHYSICAL  Renee Jacobs is a 33 y.o. female G2P0010 with IUP at [redacted]w[redacted]d (dated by LMP, Estimated Date of Delivery: 06/22/23) presenting for IOL due to new diagnosis of gHTN in setting of [redacted] weeks gestation.   Pt presented to [facility] for elevated BP -- 1748-126/95, [id number], [id number], [id number]. She reports headache earlier in the day that has since resolved.   She reports +FMs, No LOF, no VB, no blurry vision, or peripheral edema, and RUQ pain.    She plans on breast feeding. She plans on using condoms for birth control.  She received her prenatal care at  [facility]    Prenatal History/Complications:  - New onset gestational hypertension - IVF pregnancy - Hypothyroidism - Anemia of pregnancy  Past Medical History: Past Medical History:  Diagnosis Date   Anemia    Hypothyroidism    Infertility management     Past Surgical History: Past Surgical History:  Procedure Laterality Date   MOUTH SURGERY  04/2020   OTHER SURGICAL HISTORY     surg/procedures associated with IVF (egg retrieval, embryo transfer, etc)    Obstetrical History: OB History     Gravida  2   Para      Term      Preterm      AB  1   Living         SAB  1   IAB      Ectopic      Multiple      Live Births              Social History Social History   Socioeconomic History   Marital status: Married    Spouse name: Not on file   Number of children: Not on file   Years of education: Not on file   Highest education level: Not on file  Occupational History   Not on file  Tobacco Use   Smoking status: Never   Smokeless tobacco: Never  Vaping Use   Vaping status: Never Used  Substance and Sexual Activity   Alcohol use: Not Currently    Comment: occ   Drug use: Never   Sexual activity: Not Currently  Other Topics Concern   Not on file  Social History Narrative   Not on file   Social Drivers of Health   Financial Resource Strain: Not on file  Food  Insecurity: Not on file  Transportation Needs: Not on file  Physical Activity: Not on file  Stress: Not on file  Social Connections: Not on file    Family History: Family History  Problem Relation Age of Onset   Healthy Mother    Ovarian cysts Mother    Hyperlipidemia Father    Hypertension Father     Allergies: Allergies  Allergen Reactions   Beef-Derived Drug Products    Fish-Derived Products    Pork-Derived Products     Medications Prior to Admission  Medication Sig Dispense Refill Last Dose/Taking   acetaminophen  (TYLENOL ) 500 MG tablet Take 500 mg by mouth every 6 (six) hours as needed.   06/12/2023 at 10:00 AM   aspirin 81 MG chewable tablet Chew 81 mg by mouth daily.   06/11/2023   levothyroxine  (SYNTHROID ) 50 MCG tablet Take 1 tablet (50 mcg total) by mouth daily. 90 tablet 3 06/12/2023   metoCLOPramide  (REGLAN ) 10 MG tablet Take 1 tablet (10 mg total) by mouth 4 (four) times daily as needed for nausea or vomiting. 30 tablet 2  06/12/2023   ondansetron  (ZOFRAN -ODT) 8 MG disintegrating tablet Take 0.5 tablets (4 mg total) by mouth every 8 (eight) hours as needed for nausea. 60 tablet 3 06/12/2023   Prenatal Vit-Fe Fumarate-FA (PRENATAL VITAMIN PO) Take by mouth.   Past Week   Omega-3 Fatty Acids (FISH OIL) 1000 MG CAPS Take 1,000 mg by mouth daily.        Review of Systems  All systems reviewed and negative except as stated in HPI.  Blood pressure 123/87, pulse 72, temperature (!) 97.5 F (36.4 C), resp. rate 17, height 5\' 4"  (1.626 m), weight 59.9 kg, last menstrual period 09/15/2022, SpO2 98%. General appearance: alert and cooperative Lungs: breathing comfortably on room air Heart: regular rate Abdomen: soft, non-tender; gravid Extremities: no edema of bilateral lower extremities DTR's normal, no clonus of bilateral lower extremities Presentation: cephalic Fetal monitoring: 125/mod/+a/-d Uterine activity: none     Prenatal labs: ABO, Rh: B/Positive/-- (10/23  1013) Antibody: Negative (10/23 1013) Rubella: 3.44 (10/23 1013) RPR: Non Reactive (02/13 0903)  HBsAg: Negative (10/23 1013)  HIV: Non Reactive (02/13 0903)  GBS: Negative/-- (04/10 0838)  2 hr Glucola wnl Genetic screening LR Anatomy US  normal, marginal insertion of umbilical cord Last US : At [redacted]w[redacted]d - cephalic presentation, EFW 2419g (17 %tile), AC 14%tile  Prenatal Transfer Tool  Maternal Diabetes: No Genetic Screening: Normal Maternal Ultrasounds/Referrals: Normal Fetal Ultrasounds or other Referrals:  Referred to Materal Fetal Medicine  Maternal Substance Abuse:  No Significant Maternal Medications:  Meds include: Other: Synthroid , ldASA Significant Maternal Lab Results:  Group B Strep negative Number of Prenatal Visits:greater than 3 verified prenatal visits Other Comments:  None  Results for orders placed or performed during the hospital encounter of 06/12/23 (from the past 24 hours)  Protein / creatinine ratio, urine   Collection Time: 06/12/23  8:03 PM  Result Value Ref Range   Creatinine, Urine 211 mg/dL   Total Protein, Urine 23 mg/dL   Protein Creatinine Ratio 0.11 0.00 - 0.15 mg/mg[Cre]  CBC   Collection Time: 06/12/23  8:36 PM  Result Value Ref Range   WBC 8.7 4.0 - 10.5 K/uL   RBC 4.45 3.87 - 5.11 MIL/uL   Hemoglobin 14.5 12.0 - 15.0 g/dL   HCT 60.4 54.0 - 98.1 %   MCV 93.3 80.0 - 100.0 fL   MCH 32.6 26.0 - 34.0 pg   MCHC 34.9 30.0 - 36.0 g/dL   RDW 19.1 47.8 - 29.5 %   Platelets 193 150 - 400 K/uL   nRBC 0.0 0.0 - 0.2 %  Comprehensive metabolic panel with GFR   Collection Time: 06/12/23  8:36 PM  Result Value Ref Range   Sodium 136 135 - 145 mmol/L   Potassium 4.2 3.5 - 5.1 mmol/L   Chloride 107 98 - 111 mmol/L   CO2 20 (L) 22 - 32 mmol/L   Glucose, Bld 108 (H) 70 - 99 mg/dL   BUN 10 6 - 20 mg/dL   Creatinine, Ser 6.21 0.44 - 1.00 mg/dL   Calcium 9.2 8.9 - 30.8 mg/dL   Total Protein 6.4 (L) 6.5 - 8.1 g/dL   Albumin 2.7 (L) 3.5 - 5.0 g/dL   AST 37  15 - 41 U/L   ALT 40 0 - 44 U/L   Alkaline Phosphatase 127 (H) 38 - 126 U/L   Total Bilirubin 0.5 0.0 - 1.2 mg/dL   GFR, Estimated >65 >78 mL/min   Anion gap 9 5 - 15    Patient Active  Problem List   Diagnosis Date Noted   Marginal insertion of umbilical cord affecting management of mother in second trimester 01/28/2023   Pregnancy resulting from in vitro fertilization 12/11/2022   Hypothyroidism 12/11/2022   Supervision of normal pregnancy 12/10/2022    Assessment/Plan:  ONEIDA MCKAMEY is a 33 y.o. G2P0010 at [redacted]w[redacted]d here for IOL due to new diagnosis of gHTN  #Labor: Discussed IOL in detail w pt, cx 1/60/-1 at [facility] appt today  will recheck on arrival to L&D and discuss plans to initiate IOL #Pain: Desires epidural around 5-6cm #FWB: Cat I #ID:  GBS neg #MOF: Breast #MOC: Condoms #Circ:  No  #gHTN: New diagnosis, PEC labs wnl  has had mild range BP, mainly elevated diastolic Bps  will cont to monitor and eval for repeat labwork as indicated  #Hypothyroidism: Last TSH 1.760, will cont Synthroid   #IVF pregnancy  #Marginal cord insertion  Melanie Spires, MD OB Fellow, Faculty Practice [facility], Center for [facility] 06/12/2023 9:45 PM

## 2023-06-12 NOTE — Progress Notes (Signed)
 States having episodes of high blood pressure and headaches. Was checked at hospital. Induction scheduled.

## 2023-06-13 ENCOUNTER — Ambulatory Visit: Attending: Obstetrics

## 2023-06-13 ENCOUNTER — Ambulatory Visit

## 2023-06-13 ENCOUNTER — Inpatient Hospital Stay (HOSPITAL_COMMUNITY): Admitting: Anesthesiology

## 2023-06-13 ENCOUNTER — Encounter (HOSPITAL_COMMUNITY)
Admission: RE | Disposition: A | Discharge status: Home / Self Care | Payer: Self-pay | Source: Home / Self Care | Attending: Obstetrics and Gynecology

## 2023-06-13 ENCOUNTER — Other Ambulatory Visit: Payer: Self-pay

## 2023-06-13 ENCOUNTER — Encounter (HOSPITAL_COMMUNITY): Payer: Self-pay | Admitting: Obstetrics and Gynecology

## 2023-06-13 DIAGNOSIS — Z3A38 38 weeks gestation of pregnancy: Secondary | ICD-10-CM

## 2023-06-13 DIAGNOSIS — O99284 Endocrine, nutritional and metabolic diseases complicating childbirth: Secondary | ICD-10-CM | POA: Diagnosis not present

## 2023-06-13 DIAGNOSIS — O4423 Partial placenta previa NOS or without hemorrhage, third trimester: Secondary | ICD-10-CM | POA: Diagnosis not present

## 2023-06-13 DIAGNOSIS — O134 Gestational [pregnancy-induced] hypertension without significant proteinuria, complicating childbirth: Secondary | ICD-10-CM | POA: Diagnosis not present

## 2023-06-13 LAB — CBC
HCT: 40.4 % (ref 36.0–46.0)
HCT: 35 % — ABNORMAL LOW (ref 36.0–46.0)
Hemoglobin: 14.1 g/dL (ref 12.0–15.0)
Hemoglobin: 12.2 g/dL (ref 12.0–15.0)
MCH: 32.9 pg (ref 26.0–34.0)
MCH: 32.8 pg (ref 26.0–34.0)
MCHC: 34.9 g/dL (ref 30.0–36.0)
MCHC: 34.9 g/dL (ref 30.0–36.0)
MCV: 94.2 fL (ref 80.0–100.0)
MCV: 94.1 fL (ref 80.0–100.0)
Platelets: 171 10*3/uL (ref 150–400)
Platelets: 159 10*3/uL (ref 150–400)
RBC: 4.29 MIL/uL (ref 3.87–5.11)
RBC: 3.72 MIL/uL — ABNORMAL LOW (ref 3.87–5.11)
RDW: 11.9 % (ref 11.5–15.5)
RDW: 12 % (ref 11.5–15.5)
WBC: 11.3 10*3/uL — ABNORMAL HIGH (ref 4.0–10.5)
WBC: 16.3 10*3/uL — ABNORMAL HIGH (ref 4.0–10.5)
nRBC: 0 % (ref 0.0–0.2)
nRBC: 0 % (ref 0.0–0.2)

## 2023-06-13 LAB — RPR: RPR Ser Ql: NONREACTIVE

## 2023-06-13 SURGERY — Surgical Case
Anesthesia: Epidural

## 2023-06-13 MED ORDER — METOCLOPRAMIDE HCL 5 MG/ML IJ SOLN
INTRAMUSCULAR | Status: AC
Start: 2023-06-13 — End: ?
  Filled 2023-06-13: qty 2

## 2023-06-13 MED ORDER — TRANEXAMIC ACID-NACL 1000-0.7 MG/100ML-% IV SOLN
INTRAVENOUS | Status: DC | PRN
  Administered 2023-06-13: 1000 mg via INTRAVENOUS

## 2023-06-13 MED ORDER — DIPHENHYDRAMINE HCL 50 MG/ML IJ SOLN: 12.5000 mg | INTRAMUSCULAR | Status: DC | PRN

## 2023-06-13 MED ORDER — LACTATED RINGERS IV SOLN
500.0000 mL | Freq: Once | INTRAVENOUS | Status: AC
  Administered 2023-06-13: 500 mL via INTRAVENOUS

## 2023-06-13 MED ORDER — MORPHINE SULFATE (PF) 0.5 MG/ML IJ SOLN
INTRAMUSCULAR | Status: DC | PRN
Start: 2023-06-13 — End: 2023-06-13
  Administered 2023-06-13: 3 mg via EPIDURAL

## 2023-06-13 MED ORDER — METOCLOPRAMIDE HCL 5 MG/ML IJ SOLN
INTRAMUSCULAR | Status: DC | PRN
  Administered 2023-06-13: 5 mg via INTRAVENOUS

## 2023-06-13 MED ORDER — TRANEXAMIC ACID-NACL 1000-0.7 MG/100ML-% IV SOLN: 1000.0000 mg | Freq: Once | INTRAVENOUS | Status: DC

## 2023-06-13 MED ORDER — FENTANYL CITRATE (PF) 100 MCG/2ML IJ SOLN: 25.0000 ug | INTRAMUSCULAR | Status: DC | PRN

## 2023-06-13 MED ORDER — FENTANYL CITRATE (PF) 100 MCG/2ML IJ SOLN
INTRAMUSCULAR | Status: DC | PRN
  Administered 2023-06-13: 100 ug via EPIDURAL

## 2023-06-13 MED ORDER — SODIUM CHLORIDE 0.9 % IV SOLN
12.5000 mg | Freq: Once | INTRAVENOUS | Status: AC
  Administered 2023-06-13: 12.5 mg via INTRAVENOUS
  Filled 2023-06-13: qty 12.5

## 2023-06-13 MED ORDER — SOD CITRATE-CITRIC ACID 500-334 MG/5ML PO SOLN: 30.0000 mL | ORAL | Status: DC

## 2023-06-13 MED ORDER — EPHEDRINE 5 MG/ML INJ: 10.0000 mg | INTRAVENOUS | Status: DC | PRN

## 2023-06-13 MED ORDER — ACETAMINOPHEN 10 MG/ML IV SOLN
INTRAVENOUS | Status: AC
  Filled 2023-06-13: qty 100

## 2023-06-13 MED ORDER — OXYTOCIN-SODIUM CHLORIDE 30-0.9 UT/500ML-% IV SOLN
INTRAVENOUS | Status: DC | PRN
  Administered 2023-06-13: 300 mL via INTRAVENOUS

## 2023-06-13 MED ORDER — DEXAMETHASONE SODIUM PHOSPHATE 4 MG/ML IJ SOLN
INTRAMUSCULAR | Status: DC | PRN
  Administered 2023-06-13: 10 mg via INTRAVENOUS

## 2023-06-13 MED ORDER — LACTATED RINGERS AMNIOINFUSION: INTRAVENOUS | Status: DC

## 2023-06-13 MED ORDER — SCOPOLAMINE 1 MG/3DAYS TD PT72
MEDICATED_PATCH | TRANSDERMAL | Status: AC
  Filled 2023-06-13: qty 1

## 2023-06-13 MED ORDER — SODIUM CHLORIDE 0.9 % IV SOLN
500.0000 mg | INTRAVENOUS | Status: AC
  Administered 2023-06-13: 500 mg via INTRAVENOUS
  Filled 2023-06-13: qty 5

## 2023-06-13 MED ORDER — SIMETHICONE 80 MG PO CHEW
80.0000 mg | CHEWABLE_TABLET | Freq: Three times a day (TID) | ORAL | Status: DC
  Administered 2023-06-14 – 2023-06-16 (×5): 80 mg via ORAL
  Filled 2023-06-13 (×5): qty 1

## 2023-06-13 MED ORDER — KETOROLAC TROMETHAMINE 30 MG/ML IJ SOLN
INTRAMUSCULAR | Status: AC
  Filled 2023-06-13: qty 1

## 2023-06-13 MED ORDER — DIPHENHYDRAMINE HCL 50 MG/ML IJ SOLN
INTRAMUSCULAR | Status: DC | PRN
  Administered 2023-06-13: .625 mg via INTRAVENOUS

## 2023-06-13 MED ORDER — SODIUM CHLORIDE 0.9 % IV SOLN
INTRAVENOUS | Status: AC
  Filled 2023-06-13: qty 5

## 2023-06-13 MED ORDER — CEFAZOLIN SODIUM-DEXTROSE 2-4 GM/100ML-% IV SOLN: 2.0000 g | INTRAVENOUS | Status: DC

## 2023-06-13 MED ORDER — SODIUM CHLORIDE 0.9% FLUSH: 3.0000 mL | INTRAVENOUS | Status: DC | PRN

## 2023-06-13 MED ORDER — SODIUM CHLORIDE 0.9 % IV SOLN
12.5000 mg | Freq: Four times a day (QID) | INTRAVENOUS | Status: DC | PRN
  Administered 2023-06-13: 12.5 mg via INTRAVENOUS
  Filled 2023-06-13: qty 12.5

## 2023-06-13 MED ORDER — PHENYLEPHRINE 80 MCG/ML (10ML) SYRINGE FOR IV PUSH (FOR BLOOD PRESSURE SUPPORT): 80.0000 ug | PREFILLED_SYRINGE | INTRAVENOUS | Status: DC | PRN

## 2023-06-13 MED ORDER — POLYETHYLENE GLYCOL 3350 17 G PO PACK
17.0000 g | PACK | ORAL | Status: DC
  Administered 2023-06-14 – 2023-06-16 (×2): 17 g via ORAL
  Filled 2023-06-13 (×2): qty 1

## 2023-06-13 MED ORDER — ONDANSETRON HCL 4 MG/2ML IJ SOLN
INTRAMUSCULAR | Status: DC | PRN
  Administered 2023-06-13: 4 mg via INTRAVENOUS

## 2023-06-13 MED ORDER — NALOXONE HCL 4 MG/10ML IJ SOLN: 1.0000 ug/kg/h | INTRAVENOUS | Status: DC | PRN

## 2023-06-13 MED ORDER — FENTANYL-BUPIVACAINE-NACL 0.5-0.125-0.9 MG/250ML-% EP SOLN: 12.0000 mL/h | EPIDURAL | Status: DC | PRN

## 2023-06-13 MED ORDER — OXYCODONE HCL 5 MG PO TABS: 5.0000 mg | ORAL_TABLET | ORAL | Status: DC | PRN

## 2023-06-13 MED ORDER — KETOROLAC TROMETHAMINE 30 MG/ML IJ SOLN: 30.0000 mg | Freq: Four times a day (QID) | INTRAMUSCULAR | Status: AC | PRN

## 2023-06-13 MED ORDER — KETOROLAC TROMETHAMINE 30 MG/ML IJ SOLN
30.0000 mg | Freq: Four times a day (QID) | INTRAMUSCULAR | Status: AC | PRN
  Administered 2023-06-13: 30 mg via INTRAVENOUS

## 2023-06-13 MED ORDER — LEVOTHYROXINE SODIUM 50 MCG PO TABS
50.0000 ug | ORAL_TABLET | Freq: Every day | ORAL | Status: DC
  Administered 2023-06-13 – 2023-06-14 (×2): 50 ug via ORAL
  Filled 2023-06-13 (×2): qty 1

## 2023-06-13 MED ORDER — IBUPROFEN 600 MG PO TABS
600.0000 mg | ORAL_TABLET | Freq: Four times a day (QID) | ORAL | Status: DC
  Administered 2023-06-13 – 2023-06-16 (×11): 600 mg via ORAL
  Filled 2023-06-13 (×11): qty 1

## 2023-06-13 MED ORDER — MISOPROSTOL 50MCG HALF TABLET
50.0000 ug | ORAL_TABLET | Freq: Once | ORAL | Status: AC
  Administered 2023-06-13: 50 ug via ORAL
  Filled 2023-06-13: qty 1

## 2023-06-13 MED ORDER — DIPHENHYDRAMINE HCL 25 MG PO CAPS: 25.0000 mg | ORAL_CAPSULE | ORAL | Status: DC | PRN

## 2023-06-13 MED ORDER — DIPHENHYDRAMINE HCL 25 MG PO CAPS: 25.0000 mg | ORAL_CAPSULE | Freq: Four times a day (QID) | ORAL | Status: DC | PRN

## 2023-06-13 MED ORDER — LIDOCAINE-EPINEPHRINE (PF) 2 %-1:200000 IJ SOLN
INTRAMUSCULAR | Status: DC | PRN
  Administered 2023-06-13 (×2): 5 mL via INTRADERMAL

## 2023-06-13 MED ORDER — MISOPROSTOL 25 MCG QUARTER TABLET
25.0000 ug | ORAL_TABLET | Freq: Once | ORAL | Status: AC
  Administered 2023-06-13: 25 ug via VAGINAL
  Filled 2023-06-13: qty 1

## 2023-06-13 MED ORDER — PRENATAL MULTIVITAMIN CH
1.0000 | ORAL_TABLET | Freq: Every day | ORAL | Status: DC
  Administered 2023-06-14 – 2023-06-16 (×3): 1 via ORAL
  Filled 2023-06-13 (×3): qty 1

## 2023-06-13 MED ORDER — MENTHOL 3 MG MT LOZG: 1.0000 | LOZENGE | OROMUCOSAL | Status: DC | PRN

## 2023-06-13 MED ORDER — LIDOCAINE HCL (PF) 1 % IJ SOLN
INTRAMUSCULAR | Status: DC | PRN
  Administered 2023-06-13: 8 mL via EPIDURAL

## 2023-06-13 MED ORDER — DEXAMETHASONE SODIUM PHOSPHATE 4 MG/ML IJ SOLN
INTRAMUSCULAR | Status: AC
Start: 2023-06-13 — End: ?
  Filled 2023-06-13: qty 2

## 2023-06-13 MED ORDER — SIMETHICONE 80 MG PO CHEW: 80.0000 mg | CHEWABLE_TABLET | ORAL | Status: DC | PRN

## 2023-06-13 MED ORDER — PHENYLEPHRINE 80 MCG/ML (10ML) SYRINGE FOR IV PUSH (FOR BLOOD PRESSURE SUPPORT)
PREFILLED_SYRINGE | INTRAVENOUS | Status: AC
Start: 2023-06-13 — End: ?
  Filled 2023-06-13: qty 10

## 2023-06-13 MED ORDER — ONDANSETRON HCL 4 MG/2ML IJ SOLN
INTRAMUSCULAR | Status: AC
  Filled 2023-06-13: qty 2

## 2023-06-13 MED ORDER — FENTANYL-BUPIVACAINE-NACL 0.5-0.125-0.9 MG/250ML-% EP SOLN
12.0000 mL/h | EPIDURAL | Status: DC | PRN
  Administered 2023-06-13: 12 mL/h via EPIDURAL
  Filled 2023-06-13: qty 250

## 2023-06-13 MED ORDER — TERBUTALINE SULFATE 1 MG/ML IJ SOLN
0.2500 mg | Freq: Once | INTRAMUSCULAR | Status: AC | PRN
  Administered 2023-06-13: 0.25 mg via SUBCUTANEOUS
  Filled 2023-06-13: qty 1

## 2023-06-13 MED ORDER — NALOXONE HCL 0.4 MG/ML IJ SOLN: 0.4000 mg | INTRAMUSCULAR | Status: DC | PRN

## 2023-06-13 MED ORDER — POTASSIUM CHLORIDE CRYS ER 20 MEQ PO TBCR
20.0000 meq | EXTENDED_RELEASE_TABLET | Freq: Every day | ORAL | Status: DC
  Administered 2023-06-13 – 2023-06-16 (×4): 20 meq via ORAL
  Filled 2023-06-13 (×4): qty 1

## 2023-06-13 MED ORDER — MEPERIDINE HCL 25 MG/ML IJ SOLN: 6.2500 mg | INTRAMUSCULAR | Status: DC | PRN

## 2023-06-13 MED ORDER — MORPHINE SULFATE (PF) 0.5 MG/ML IJ SOLN
INTRAMUSCULAR | Status: AC
  Filled 2023-06-13: qty 10

## 2023-06-13 MED ORDER — FENTANYL CITRATE (PF) 100 MCG/2ML IJ SOLN
INTRAMUSCULAR | Status: AC
  Filled 2023-06-13: qty 2

## 2023-06-13 MED ORDER — PROPOFOL 10 MG/ML IV BOLUS
INTRAVENOUS | Status: DC | PRN
  Administered 2023-06-13: 20 mg via INTRAVENOUS

## 2023-06-13 MED ORDER — PHENYLEPHRINE HCL-NACL 20-0.9 MG/250ML-% IV SOLN
INTRAVENOUS | Status: AC
  Filled 2023-06-13: qty 250

## 2023-06-13 MED ORDER — OXYTOCIN-SODIUM CHLORIDE 30-0.9 UT/500ML-% IV SOLN
INTRAVENOUS | Status: AC
  Filled 2023-06-13: qty 500

## 2023-06-13 MED ORDER — FUROSEMIDE 20 MG PO TABS
20.0000 mg | ORAL_TABLET | Freq: Every day | ORAL | Status: DC
  Administered 2023-06-13 – 2023-06-14 (×2): 20 mg via ORAL
  Filled 2023-06-13 (×2): qty 1

## 2023-06-13 MED ORDER — LEVOTHYROXINE SODIUM 50 MCG PO TABS: 50.0000 ug | ORAL_TABLET | Freq: Every day | ORAL | Status: DC

## 2023-06-13 MED ORDER — CEFAZOLIN SODIUM-DEXTROSE 2-3 GM-%(50ML) IV SOLR
INTRAVENOUS | Status: DC | PRN
  Administered 2023-06-13: 2 g via INTRAVENOUS

## 2023-06-13 MED ORDER — SCOPOLAMINE 1 MG/3DAYS TD PT72
1.0000 | MEDICATED_PATCH | TRANSDERMAL | Status: DC
  Administered 2023-06-13: 1.5 mg via TRANSDERMAL

## 2023-06-13 MED ORDER — DIBUCAINE (PERIANAL) 1 % EX OINT: 1.0000 | TOPICAL_OINTMENT | CUTANEOUS | Status: DC | PRN

## 2023-06-13 MED ORDER — LACTATED RINGERS IV SOLN: 500.0000 mL | Freq: Once | INTRAVENOUS | Status: DC

## 2023-06-13 MED ORDER — ONDANSETRON HCL 4 MG/2ML IJ SOLN
4.0000 mg | Freq: Three times a day (TID) | INTRAMUSCULAR | Status: DC | PRN
  Administered 2023-06-13: 4 mg via INTRAVENOUS

## 2023-06-13 MED ORDER — OXYTOCIN-SODIUM CHLORIDE 30-0.9 UT/500ML-% IV SOLN
2.5000 [IU]/h | INTRAVENOUS | Status: AC
  Filled 2023-06-13: qty 500

## 2023-06-13 MED ORDER — SODIUM CHLORIDE 0.9 % IR SOLN
Status: DC | PRN
  Administered 2023-06-13: 1

## 2023-06-13 MED ORDER — PROPOFOL 10 MG/ML IV BOLUS
INTRAVENOUS | Status: AC
  Filled 2023-06-13: qty 20

## 2023-06-13 MED ORDER — STERILE WATER FOR IRRIGATION IR SOLN
Status: DC | PRN
  Administered 2023-06-13: 1000 mL

## 2023-06-13 MED ORDER — GABAPENTIN 300 MG PO CAPS
300.0000 mg | ORAL_CAPSULE | Freq: Two times a day (BID) | ORAL | Status: DC
  Administered 2023-06-13 – 2023-06-16 (×6): 300 mg via ORAL
  Filled 2023-06-13 (×6): qty 1

## 2023-06-13 MED ORDER — COCONUT OIL OIL: 1.0000 | TOPICAL_OIL | Status: DC | PRN

## 2023-06-13 MED ORDER — ONDANSETRON HCL 4 MG/2ML IJ SOLN
INTRAMUSCULAR | Status: AC
Start: 2023-06-13 — End: ?
  Filled 2023-06-13: qty 2

## 2023-06-13 MED ORDER — TRANEXAMIC ACID-NACL 1000-0.7 MG/100ML-% IV SOLN
INTRAVENOUS | Status: AC
  Filled 2023-06-13: qty 100

## 2023-06-13 MED ORDER — WITCH HAZEL-GLYCERIN EX PADS: 1.0000 | MEDICATED_PAD | CUTANEOUS | Status: DC | PRN

## 2023-06-13 SURGICAL SUPPLY — 30 items
BENZOIN TINCTURE PRP APPL 2/3 (GAUZE/BANDAGES/DRESSINGS) ×1 IMPLANT
CANISTER SUCT 3000ML PPV (MISCELLANEOUS) ×1 IMPLANT
CHLORAPREP W/TINT 26 (MISCELLANEOUS) ×2 IMPLANT
CLAMP UMBILICAL CORD (MISCELLANEOUS) ×1 IMPLANT
DRSG OPSITE POSTOP 4X10 (GAUZE/BANDAGES/DRESSINGS) ×1 IMPLANT
ELECTRODE REM PT RTRN 9FT ADLT (ELECTROSURGICAL) ×1 IMPLANT
EXTRACTOR VACUUM KIWI (MISCELLANEOUS) ×1 IMPLANT
GAUZE PAD ABD 7.5X8 STRL (GAUZE/BANDAGES/DRESSINGS) IMPLANT
GAUZE SPONGE 4X4 12PLY STRL LF (GAUZE/BANDAGES/DRESSINGS) IMPLANT
GLOVE BIOGEL PI IND STRL 7.0 (GLOVE) ×2 IMPLANT
GLOVE BIOGEL PI IND STRL 7.5 (GLOVE) ×1 IMPLANT
GLOVE SURG SS PI 7.0 STRL IVOR (GLOVE) ×1 IMPLANT
GOWN STRL REUS W/ TWL LRG LVL3 (GOWN DISPOSABLE) ×2 IMPLANT
GOWN STRL REUS W/ TWL XL LVL3 (GOWN DISPOSABLE) ×1 IMPLANT
NS IRRIG 1000ML POUR BTL (IV SOLUTION) ×1 IMPLANT
PACK C SECTION WH (CUSTOM PROCEDURE TRAY) ×1 IMPLANT
PAD ABD 7.5X8 STRL (GAUZE/BANDAGES/DRESSINGS) ×1 IMPLANT
PAD OB MATERNITY 4.3X12.25 (PERSONAL CARE ITEMS) ×1 IMPLANT
PAD PREP 24X48 CUFFED NSTRL (MISCELLANEOUS) ×1 IMPLANT
RETRACTOR WND ALEXIS 25 LRG (MISCELLANEOUS) ×1 IMPLANT
RETRACTOR WOUND ALXS 25CM LRG (MISCELLANEOUS) ×1 IMPLANT
STRIP CLOSURE SKIN 1/2X4 (GAUZE/BANDAGES/DRESSINGS) ×1 IMPLANT
SUT MNCRL 0 VIOLET CTX 36 (SUTURE) ×2 IMPLANT
SUT MON AB 4-0 PS1 27 (SUTURE) ×1 IMPLANT
SUT PLAIN 2 0 XLH (SUTURE) ×1 IMPLANT
SUT VIC AB 0 CT1 36 (SUTURE) ×2 IMPLANT
SUT VIC AB 3-0 CT1 TAPERPNT 27 (SUTURE) ×1 IMPLANT
SUT VICRYL+ 3-0 36IN CT-1 (SUTURE) IMPLANT
TOWEL OR 17X24 6PK STRL BLUE (TOWEL DISPOSABLE) ×2 IMPLANT
WATER STERILE IRR 1000ML POUR (IV SOLUTION) ×1 IMPLANT

## 2023-06-13 NOTE — Brief Op Note (Signed)
 06/12/2023 - 06/13/2023  2:44 PM  PATIENT:  Renee Jacobs  33 y.o. female  PRE-OPERATIVE DIAGNOSIS:  Non reassuring fetal heart rate remote from delivery  POST-OPERATIVE DIAGNOSIS:  Non reassuring fetal heart rate remote from delivery. Nuchal cord. Asynclitic   PROCEDURE: LTCS  SURGEON:  Surgeons and Role:    * Raynell Caller, MD - Primary    * Darrow End, MD - Resident - Assisting  ANESTHESIA:   epidural  EBL:    BLOOD ADMINISTERED:none  DRAINS: 50mL UOP via foley  IVF: crystalloid   LOCAL MEDICATIONS USED:  NONE  SPECIMEN:  placenta and cord gases  DISPOSITION OF SPECIMEN:  PATHOLOGY  COUNTS:  YES  TOURNIQUET:  * No tourniquets in log *  DICTATION: .Note written in EPIC  PLAN OF CARE: Admit to inpatient   PATIENT DISPOSITION:  PACU - hemodynamically stable.   Delay start of Pharmacological VTE agent (>24hrs) due to surgical blood loss or risk of bleeding: yes   Tyler Gallant MD Attending Center for [employer] (Faculty Practice) 06/13/2023 Time: [phone]

## 2023-06-13 NOTE — Progress Notes (Signed)
 LABOR PROGRESS NOTE  Patient Name: Renee Jacobs, female   DOB: [birth year], 33 y.o.  MRN: [id number]  Pt with cat 1 strip, regular contractions.  Agreeable to FB.  Cook placed 60/60.  Mom and baby tolerated well.  Can add pitocin  if ctx space and 4 hrs from last cytotec  dose.  Pt agreeable and understanding of the plan.   Ebony Goldstein, MD

## 2023-06-13 NOTE — Discharge Instructions (Signed)
Continue to check your blood pressures twice a day Call the office for blood pressures that are consistently above 140 for the top number or 90 for the bottom number   Hypertension During Pregnancy Hypertension is also called high blood pressure. High blood pressure means that the force of your blood moving in your body is too strong. It can cause problems for you and your baby. Different types of high blood pressure can happen during pregnancy. The types are: High blood pressure before you got pregnant. This is called chronic hypertension.  This can continue during your pregnancy. Your doctor will want to keep checking your blood pressure. You may need medicine to keep your blood pressure under control while you are pregnant. You will need follow-up visits after you have your baby. High blood pressure that goes up during pregnancy when it was normal before. This is called gestational hypertension. It will usually get better after you have your baby, but your doctor will need to watch your blood pressure to make sure that it is getting better. Very high blood pressure during pregnancy. This is called preeclampsia. Very high blood pressure is an emergency that needs to be checked and treated right away. You may develop very high blood pressure after giving birth. This is called postpartum preeclampsia. This usually occurs within 48 hours after childbirth but may occur up to 6 weeks after giving birth. This is rare. How does this affect me? If you have high blood pressure during pregnancy, you have a higher chance of developing high blood pressure: As you get older. If you get pregnant again. In some cases, high blood pressure during pregnancy can cause: Stroke. Heart attack. Damage to the kidneys, lungs, or liver. Preeclampsia. Jerky movements you cannot control (convulsions or seizures). Problems with the placenta.  What can I do to lower my risk?  Keep a healthy weight. Eat a healthy  diet. Follow what your doctor tells you about treating any medical problems that you had before becoming pregnant. It is very important to go to all of your doctor visits. Your doctor will check your blood pressure and make sure that your pregnancy is progressing as it should. Treatment should start early if a problem is found.  Follow these instructions at home:  Take your blood pressure 1-2 times per day. Call the office if your blood pressure is 155 or higher for the top number or 105 or higher for the bottom number.    Eating and drinking  Drink enough fluid to keep your pee (urine) pale yellow. Avoid caffeine. Lifestyle Do not use any products that contain nicotine or tobacco, such as cigarettes, e-cigarettes, and chewing tobacco. If you need help quitting, ask your doctor. Do not use alcohol or drugs. Avoid stress. Rest and get plenty of sleep. Regular exercise can help. Ask your doctor what kinds of exercise are best for you. General instructions Take over-the-counter and prescription medicines only as told by your doctor. Keep all prenatal and follow-up visits as told by your doctor. This is important. Contact a doctor if: You have symptoms that your doctor told you to watch for, such as: Headaches. Nausea. Vomiting. Belly (abdominal) pain. Dizziness. Light-headedness. Get help right away if: You have: Very bad belly pain that does not get better with treatment. A very bad headache that does not get better. Vomiting that does not get better. Sudden, fast weight gain. Sudden swelling in your hands, ankles, or face. Blood in your pee. Blurry vision. Double vision.  Shortness of breath. Chest pain. Weakness on one side of your body. Trouble talking. Summary High blood pressure is also called hypertension. High blood pressure means that the force of your blood moving in your body is too strong. High blood pressure can cause problems for you and your baby. Keep all  follow-up visits as told by your doctor. This is important. This information is not intended to replace advice given to you by your health care provider. Make sure you discuss any questions you have with your health care provider. Document Released: 03/09/2010 Document Revised: 05/28/2018 Document Reviewed: 03/03/2018 Elsevier Patient Education  2020 [employer].

## 2023-06-13 NOTE — Progress Notes (Signed)
 L&D Note  Patient here for IOL for GHTN. Patient progressed well with balloon IOL to 5-6cm. Once at 5-6cm baby started having decels (lates and variables) this responded to terb but came back and got better with IUPC and AI but then worsened. Cervix unchanged and given deep variables and lates, I d/w mom and dad recommendation for c-section. R/b of c/s d/w them and they are amenable to this.  D/w OR and will proceed with Urgent c/s for fetal intolerance of labor remote from term  Tyler Gallant MD Attending Center for [employer] (Faculty Practice) 06/13/2023 Time: [phone]

## 2023-06-13 NOTE — Op Note (Signed)
 Operative Note   SURGERY DATE: 06/13/2023  PRE-OP DIAGNOSIS:  *Pregnancy at 38/5 *Fetal intolerance of labor at 5-6cm *GHTN IOL *IVF pregnancy with marginal cord insertion  POST-OP DIAGNOSIS: Same. Delivered   PROCEDURE: Urgent prrimary low transverse cesarean section via pfannenstiel skin incision with double layer uterine closure  SURGEON: Surgeons and Role:    * Raynell Caller, MD - Primary  ASSISTANT:    Darrow End, MD - Resident - Assisting  An experienced assistant was required given the standard of surgical care given the complexity of the case.  This assistant was needed for exposure, dissection, suctioning, retraction, instrument exchange, assisting with delivery with administration of fundal pressure, and for overall help during the procedure.  ANESTHESIA: epidural  ESTIMATED BLOOD LOSS:   DRAINS: 50mL UOP via indwelling foley  TOTAL IV FLUIDS: 2000mL crystalloid  VTE PROPHYLAXIS: SCDs to bilateral lower extremities  ANTIBIOTICS: Two grams of Cefazolin  were given., within 1 hour of skin incision and azithromycin  500mg  IV intraoperatively.   SPECIMENS: placenta and cord gases  COMPLICATIONS: none  FINDINGS: No intra-abdominal adhesions were noted. Grossly normal uterus, tubes and ovaries; there was a 3cm subserosal fibroid a 2cm above the left lateral edge of the hysterotomy. Clear amniotic fluid, cephalic/br female infant, weight 2590gm, APGARs 8/9, intact placenta. Arterial pH 7.29/CO2 50 /Bicarb 24/A-B deficit 3.1 Venous pH 7.32/CO2 49/ Bicarb 25.2/ A-B deficit 1.4  PROCEDURE IN DETAIL: The patient was taken to the operating room where anesthesia was administered and normal fetal heart tones were confirmed. She was then prepped and draped in the normal fashion in the dorsal supine position with a leftward tilt.  After a time out was performed, a pfannensteil skin incision was made with the scalpel and carried through to the underlying layer of fascia.  The fascia was then incised at the midline and this incision was extended laterally with the mayo scissors. Attention was turned to the superior aspect of the fascial incision which was grasped with the kocher clamps x 2, tented up and the rectus muscles were dissected off with the bovie. In a similar fashion the inferior aspect of the fascial incision was grasped with the kocher clamps, tented up and the rectus muscles dissected off with the mayo scissors. The rectus muscles were then separated in the midline and the peritoneum was entered bluntly. The bladder blade was inserted and the vesicouterine peritoneum was identified, tented up and entered with the metzenbaum scissors. This incision was extended laterally and the bladder flap was created digitally. The bladder blade was reinserted.  A low transverse hysterotomy was made with the scalpel until the endometrial cavity was breached and the amniotic sac ruptured, yielding clear amniotic fluid. This incision was extended bluntly and the infant's head, shoulders and body were delivered atraumatically.The cord was clamped x 2 and cut, and the infant was handed to the awaiting pediatricians, after delayed cord clamping was done.  The placenta was then gradually expressed from the uterus and then the uterus was exteriorized and cleared of all clots and debris. The hysterotomy was repaired with a running suture of 1-0 monocryl. A second imbricating layer of 1-0 monocryl suture was then placed to achieve excellent hemostasis.   The uterus and adnexa were then returned to the abdomen, and the hysterotomy and all operative sites were reinspected and excellent hemostasis was noted after irrigation and suction of the abdomen with warm saline.  The peritoneum was closed with a running stitch of 3-0 Vicryl. The fascia was  reapproximated with 0 Vicryl in a simple running fashion bilaterally. The subcutaneous layer was then reapproximated with interrupted sutures of 2-0  plain gut, and the skin was then closed with 4-0 monocryl, in a subcuticular fashion.  The patient  tolerated the procedure well. Sponge, lap, needle, and instrument counts were correct x 2. The patient was transferred to the recovery room awake, alert and breathing independently in stable condition.  Tyler Gallant MD Attending Center for [facility])

## 2023-06-13 NOTE — Discharge Summary (Signed)
 Postpartum Discharge Summary     Patient Name: Renee Jacobs DOB: 1990-04-05 MRN: [id number]  Date of admission: 06/12/2023 Delivery date:06/13/2023 Delivering provider: Raynell Caller Date of discharge: 06/16/2023 OB Office: [facility]  Admitting diagnosis: Pregnancy at 38/4. IOL for GHTN.   Additional problems: IVF pregnancy. Marginal cord insertion. Hypothyroidism.     Discharge diagnosis: Term Pregnancy Delivered                                              Post partum procedures: None Augmentation: Dual cyototec. Cook Balloon. Pitocin . AROM.  Complications: fetal intolerance of labor at 5-6cm. Nuchal cords. Asynclitic   Hospital course: Patient came to [facility] for HAs (resolved) and elevated BPs at home and confirmed for [facility]. Patient had the above IOL and LTCS.  Details of operation can be found in separate operative note.  Patient had an uncomplicated postpartum course. She was started on just lasix  and kdur and she did well on this.  She is ambulating, tolerating a regular diet, passing flatus, and urinating well.  Patient is discharged home in stable condition on 06/16/23.      Newborn Data: Birth date:06/13/2023 Birth time:1:58 PM Gender:Female Living status:Living Apgars:8 ,9  Weight:2590 g                               Magnesium Sulfate received: No BMZ received: No Transfusion:No Immunizations administered: Immunization History  Administered Date(s) Administered   Influenza,inj,Quad PF,6+ Mos 02/06/2023   Tdap 04/17/2023    Physical exam  Vitals:   06/15/23 0528 06/15/23 1525 06/15/23 2108 06/16/23 0452  BP: 134/89 129/87 134/81 129/79  Pulse:  (!) 55 60 60  Resp:  18 18 18   Temp:  97.7 F (36.5 C) 98.3 F (36.8 C) 98.3 F (36.8 C)  TempSrc:  Oral Oral Oral  SpO2:   99%   Weight:      Height:       General: alert, cooperative, and no distress Lochia: appropriate Uterine Fundus: firm Incision: Dressing is clean, dry, and intact DVT Evaluation:  No cords or calf tenderness. No significant calf/ankle edema. Labs: Lab Results  Component Value Date   WBC 18.0 (H) 06/14/2023   HGB 11.5 (L) 06/14/2023   HCT 32.9 (L) 06/14/2023   MCV 94.3 06/14/2023   PLT 192 06/14/2023      Latest Ref Rng & Units 06/12/2023    8:36 PM  CMP  Glucose 70 - 99 mg/dL 846   BUN 6 - 20 mg/dL 10   Creatinine 9.62 - 1.00 mg/dL 9.52   Sodium 841 - 324 mmol/L 136   Potassium 3.5 - 5.1 mmol/L 4.2   Chloride 98 - 111 mmol/L 107   CO2 22 - 32 mmol/L 20   Calcium 8.9 - 10.3 mg/dL 9.2   Total Protein 6.5 - 8.1 g/dL 6.4   Total Bilirubin 0.0 - 1.2 mg/dL 0.5   Alkaline Phos 38 - 126 U/L 127   AST 15 - 41 U/L 37   ALT 0 - 44 U/L 40    Edinburgh Score:    06/14/2023    8:00 AM  Edinburgh Postnatal Depression Scale Screening Tool  I have been able to laugh and see the funny side of things. 0  I have looked forward with enjoyment to things. 0  I have blamed myself unnecessarily when things went wrong. 1  I have been anxious or worried for no good reason. 2  I have felt scared or panicky for no good reason. 0  Things have been getting on top of me. 0  I have been so unhappy that I have had difficulty sleeping. 0  I have felt sad or miserable. 0  I have been so unhappy that I have been crying. 0  The thought of harming myself has occurred to me. 0  Edinburgh Postnatal Depression Scale Total 3      After visit meds:  Allergies as of 06/16/2023       Reactions   Beef-derived Drug Products    Fish-derived Products    Pork-derived Products         Medication List     STOP taking these medications    aspirin 81 MG chewable tablet   metoCLOPramide  10 MG tablet Commonly known as: REGLAN        TAKE these medications    acetaminophen  500 MG tablet Commonly known as: TYLENOL  Take 500 mg by mouth every 6 (six) hours as needed.   ferrous sulfate 325 (65 FE) MG tablet Take 1 tablet (325 mg total) by mouth every other day. Start taking on:  June 17, 2023   Fish Oil 1000 MG Caps Take 1,000 mg by mouth daily.   furosemide  40 MG tablet Commonly known as: LASIX  Take 1 tablet (40 mg total) by mouth daily.   ibuprofen  600 MG tablet Commonly known as: ADVIL  Take 1 tablet (600 mg total) by mouth every 6 (six) hours.   levothyroxine  50 MCG tablet Commonly known as: SYNTHROID  Take 1 tablet (50 mcg total) by mouth daily.   ondansetron  8 MG disintegrating tablet Commonly known as: ZOFRAN -ODT Take 0.5 tablets (4 mg total) by mouth every 8 (eight) hours as needed for nausea.   oxyCODONE  5 MG immediate release tablet Commonly known as: Oxy IR/ROXICODONE  Take 1 tablet (5 mg total) by mouth every 6 (six) hours as needed for severe pain (pain score 7-10) or breakthrough pain.   polyethylene glycol 17 g packet Commonly known as: MIRALAX / GLYCOLAX Take 17 g by mouth every other day.   potassium chloride  SA 20 MEQ tablet Commonly known as: KLOR-CON  M Take 2 tablets (40 mEq total) by mouth daily.   PRENATAL VITAMIN PO Take by mouth.         Discharge home in stable condition Infant Feeding:  Formula Infant Disposition:home with mother Discharge instruction: per After Visit Summary and Postpartum booklet. Activity: Advance as tolerated. Pelvic rest for 6 weeks.  Diet: routine diet Anticipated Birth Control: Condoms Postpartum Appointment:1 week Additional Postpartum F/U: Incision check 1 week and BP check 1 week Future Appointments: Future Appointments  Date Time Provider Department Center  07/23/2023  8:50 AM Lacey Pian, MD [facility]   Follow up Visit:  Follow-up Information     [facility] for [facility] at [facility]. Go in 1 week(s).   Specialty: Obstetrics and Gynecology Why: incision and blood pressure check. the office will call you with this appointment Contact information: [address] [place]  [address] [phone]                [X]  message sent to office on 4/25 for 1wk visit     06/16/2023 Melanie Spires, MD

## 2023-06-13 NOTE — Progress Notes (Signed)
 Renee Jacobs is a 33 y.o. G2P0010 at [redacted]w[redacted]d admitted for induction of labor due to [facility].  Subjective: Pt comfortable with epidural. Husband in room for support.   Objective: BP (!) 95/55   Pulse (!) 109   Temp 98.1 F (36.7 C) (Oral)   Resp 16   Ht 5\' 4"  (1.626 m)   Wt 59.9 kg   LMP 09/15/2022   SpO2 99%   BMI 22.66 kg/m  No intake/output data recorded. No intake/output data recorded.  FHT:  FHR: 155 bpm, variability: moderate,  accelerations:  Abscent,  decelerations:  Present repetitive lates UC:   regular, every 3-4 minutes SVE:   Dilation: 6 Effacement (%): 80 Station: -2 Exam by:: Arlester Bence CNM  Labs: Lab Results  Component Value Date   WBC 11.3 (H) 06/13/2023   HGB 14.1 06/13/2023   HCT 40.4 06/13/2023   MCV 94.2 06/13/2023   PLT 171 06/13/2023    Assessment / Plan: IOL, progressing well  Labor: Ctx returned after terbutaline  dose and decelerations returned.  Start amnioinfusion. Late decels but with sharp decline, more variable in nature.  Dr Aquilla Knapp aware and reviewed FHR tracing. Preeclampsia:  labs stable Fetal Wellbeing:  Category II Pain Control:  Epidural I/D:   GBS neg Anticipated MOD:   undetermined  Arlester Bence, CNM 06/13/2023, 11:55 AM

## 2023-06-13 NOTE — Transfer of Care (Signed)
 Immediate Anesthesia Transfer of Care Note  Patient: Renee Jacobs  Procedure(s) Performed: CESAREAN DELIVERY  Patient Location: PACU  Anesthesia Type:Epidural  Level of Consciousness: awake, alert , and oriented  Airway & Oxygen Therapy: Patient Spontanous Breathing  Post-op Assessment: Report given to RN and Post -op Vital signs reviewed and stable  Post vital signs: Reviewed and stable  Last Vitals:  Vitals Value Taken Time  BP 129/77 06/13/23 1503  Temp    Pulse 130 06/13/23 1506  Resp 23 06/13/23 1506  SpO2 99 % 06/13/23 1506  Vitals shown include unfiled device data.  Last Pain:  Vitals:   06/13/23 1301  TempSrc: Oral  PainSc:          Complications: No notable events documented.

## 2023-06-13 NOTE — Plan of Care (Signed)
 Problem: Education: Goal: Knowledge of General Education information will improve Description: Including pain rating scale, medication(s)/side effects and non-pharmacologic comfort measures 06/13/2023 0512 by Catharine Clock, RN Outcome: Progressing 06/13/2023 0511 by Catharine Clock, RN Outcome: Progressing   Problem: Health Behavior/Discharge Planning: Goal: Ability to manage health-related needs will improve 06/13/2023 0512 by Catharine Clock, RN Outcome: Progressing 06/13/2023 0511 by Catharine Clock, RN Outcome: Progressing   Problem: Clinical Measurements: Goal: Ability to maintain clinical measurements within normal limits will improve 06/13/2023 0512 by Catharine Clock, RN Outcome: Progressing 06/13/2023 0511 by Catharine Clock, RN Outcome: Progressing Goal: Will remain free from infection 06/13/2023 0512 by Catharine Clock, RN Outcome: Progressing 06/13/2023 0511 by Catharine Clock, RN Outcome: Progressing Goal: Diagnostic test results will improve 06/13/2023 0512 by Catharine Clock, RN Outcome: Progressing 06/13/2023 0511 by Catharine Clock, RN Outcome: Progressing Goal: Respiratory complications will improve 06/13/2023 0512 by Catharine Clock, RN Outcome: Progressing 06/13/2023 0511 by Jackie Mas T, RN Outcome: Progressing Goal: Cardiovascular complication will be avoided 06/13/2023 0512 by Catharine Clock, RN Outcome: Progressing 06/13/2023 0511 by Catharine Clock, RN Outcome: Progressing   Problem: Activity: Goal: Risk for activity intolerance will decrease 06/13/2023 0512 by Catharine Clock, RN Outcome: Progressing 06/13/2023 0511 by Catharine Clock, RN Outcome: Progressing   Problem: Nutrition: Goal: Adequate nutrition will be maintained 06/13/2023 0512 by Catharine Clock, RN Outcome: Progressing 06/13/2023 0511 by Catharine Clock, RN Outcome: Progressing   Problem: Coping: Goal: Level  of anxiety will decrease 06/13/2023 0512 by Catharine Clock, RN Outcome: Progressing 06/13/2023 0511 by Catharine Clock, RN Outcome: Progressing   Problem: Elimination: Goal: Will not experience complications related to bowel motility 06/13/2023 0512 by Catharine Clock, RN Outcome: Progressing 06/13/2023 0511 by Catharine Clock, RN Outcome: Progressing Goal: Will not experience complications related to urinary retention 06/13/2023 0512 by Catharine Clock, RN Outcome: Progressing 06/13/2023 0511 by Catharine Clock, RN Outcome: Progressing   Problem: Pain Managment: Goal: General experience of comfort will improve and/or be controlled 06/13/2023 0512 by Catharine Clock, RN Outcome: Progressing 06/13/2023 0511 by Catharine Clock, RN Outcome: Progressing   Problem: Safety: Goal: Ability to remain free from injury will improve 06/13/2023 0512 by Catharine Clock, RN Outcome: Progressing 06/13/2023 0511 by Catharine Clock, RN Outcome: Progressing   Problem: Skin Integrity: Goal: Risk for impaired skin integrity will decrease 06/13/2023 0512 by Catharine Clock, RN Outcome: Progressing 06/13/2023 0511 by Catharine Clock, RN Outcome: Progressing   Problem: Education: Goal: Knowledge of disease or condition will improve 06/13/2023 0512 by Catharine Clock, RN Outcome: Progressing 06/13/2023 0511 by Catharine Clock, RN Outcome: Progressing Goal: Knowledge of the prescribed therapeutic regimen will improve 06/13/2023 0512 by Catharine Clock, RN Outcome: Progressing 06/13/2023 0511 by Catharine Clock, RN Outcome: Progressing   Problem: Fluid Volume: Goal: Peripheral tissue perfusion will improve 06/13/2023 0512 by Jackie Mas T, RN Outcome: Progressing 06/13/2023 0511 by Catharine Clock, RN Outcome: Progressing   Problem: Clinical Measurements: Goal: Complications related to disease process, condition or treatment  will be avoided or minimized 06/13/2023 0512 by Catharine Clock, RN Outcome: Progressing 06/13/2023 0511 by Catharine Clock, RN Outcome: Progressing   Problem: Education: Goal: Knowledge of Childbirth will improve 06/13/2023 0512 by Catharine Clock, RN Outcome: Progressing 06/13/2023 0511 by Catharine Clock, RN Outcome: Progressing Goal: Ability to make informed decisions regarding treatment and  plan of care will improve 06/13/2023 0512 by Catharine Clock, RN Outcome: Progressing 06/13/2023 0511 by Catharine Clock, RN Outcome: Progressing Goal: Ability to state and carry out methods to decrease the pain will improve 06/13/2023 0512 by Catharine Clock, RN Outcome: Progressing 06/13/2023 0511 by Catharine Clock, RN Outcome: Progressing Goal: Individualized Educational Video(s) 06/13/2023 0512 by Catharine Clock, RN Outcome: Progressing 06/13/2023 0511 by Catharine Clock, RN Outcome: Progressing   Problem: Coping: Goal: Ability to verbalize concerns and feelings about labor and delivery will improve 06/13/2023 0512 by Catharine Clock, RN Outcome: Progressing 06/13/2023 0511 by Catharine Clock, RN Outcome: Progressing   Problem: Life Cycle: Goal: Ability to make normal progression through stages of labor will improve 06/13/2023 0512 by Catharine Clock, RN Outcome: Progressing 06/13/2023 0511 by Catharine Clock, RN Outcome: Progressing Goal: Ability to effectively push during vaginal delivery will improve 06/13/2023 0512 by Catharine Clock, RN Outcome: Progressing 06/13/2023 0511 by Catharine Clock, RN Outcome: Progressing   Problem: Role Relationship: Goal: Will demonstrate positive interactions with the child 06/13/2023 7846 by Catharine Clock, RN Outcome: Progressing 06/13/2023 0511 by Catharine Clock, RN Outcome: Progressing   Problem: Safety: Goal: Risk of complications during labor and delivery will  decrease 06/13/2023 0512 by Jackie Mas T, RN Outcome: Progressing 06/13/2023 0511 by Catharine Clock, RN Outcome: Progressing   Problem: Pain Management: Goal: Relief or control of pain from uterine contractions will improve 06/13/2023 0512 by Catharine Clock, RN Outcome: Progressing 06/13/2023 0511 by Catharine Clock, RN Outcome: Progressing

## 2023-06-13 NOTE — Lactation Note (Signed)
 This note was copied from a baby's chart. Lactation Consultation Note  Patient Name: Renee Jacobs Chancellor [id number] Date: 06/13/2023 Age:34 hours Reason for consult: Initial assessment;Mother's request;Primapara;1st time breastfeeding;Early term 37-38.6wks;Infant < 6lbs;Maternal endocrine disorder;Breastfeeding assistance;RN request (IVF pregnancy)  P1- Infant was born at [redacted]w[redacted]d weighing 2590g. [name]'s RN requested assistance with latching infant. The RN created a feeding plan for infant and brought in Similac Neosure for [name] to supplement with. Infant was very eager to nurse at this time, but struggled with opening his mouth. Infant preferred to suck on his top lip instead. We attempted to latch infant in both the football and cross cradle hold multiple times on the right breast. There were a few times that infant was able to latch, but he would immediately slip off of [name]'s tight breasts. After 8 minutes of trying to latch. LC encouraged using a nipple shield for infant. [name] in agreement after being provided with education. LC placed a 16 mm nipple shield on [name]'s right nipple. Infant was able to latch immediately with the shield and nurse for 7 minutes. LC then taught [name] how to pace feed infant in a side lying position with the formula. Infant was able to take 12 mL fo formula with the white Nfant nipple. LC encouraged setting up the hospital DEBP, but [name] declined and requested to use her home pump instead.  Feeding as follows: -Entire feeding to last 30 minutes or less. -Pump for 15 minutes every 3 hrs after a feeding. -Supplementation for day 1 is a minimum of 12-14 mL, day 2 is 18-21 mL and day 3 is 24-28 mL.  LC reviewed the first 24 hr birthday nap, day 2 cluster feeding, feeding infant on cue 8-12x in 24 hrs, not allowing infant to go over 3 hrs without a feeding, CDC milk storage guidelines, LC services handout, and engorgement/breast care. LC encouraged [name] to call for further assistance as  needed.  Maternal Data Has patient been taught Hand Expression?: Yes Does the patient have breastfeeding experience prior to this delivery?: No  Feeding Mother's Current Feeding Choice: Breast Milk and Formula Nipple Type: Nfant Standard Flow (white)  LATCH Score Latch: Repeated attempts needed to sustain latch, nipple held in mouth throughout feeding, stimulation needed to elicit sucking reflex.  Audible Swallowing: None  Type of Nipple: Everted at rest and after stimulation  Comfort (Breast/Nipple): Soft / non-tender  Hold (Positioning): Full assist, staff holds infant at breast  LATCH Score: 5   Lactation Tools Discussed/Used Tools: Nipple Shields Nipple shield size: 16 Pump Education: Milk Storage  Interventions Interventions: Breast feeding basics reviewed;Assisted with latch;Hand express;Breast compression;Adjust position;Support pillows;Position options;Education;Pace feeding;LC Services brochure;LPT handout/interventions  Discharge Discharge Education: Engorgement and breast care;Warning signs for feeding baby;Outpatient recommendation Pump: DEBP;Personal  Consult Status Consult Status: Follow-up Date: 06/14/23 Follow-up type: In-patient    Vernette Goo BS, IBCLC 06/13/2023, 7:19 PM

## 2023-06-13 NOTE — Anesthesia Preprocedure Evaluation (Addendum)
 Anesthesia Evaluation  Patient identified by MRN, date of birth, ID band Patient awake    Reviewed: Allergy & Precautions, Patient's Chart, lab work & pertinent test results  Airway Mallampati: III       Dental no notable dental hx. (+) Teeth Intact, Dental Advisory Given   Pulmonary neg pulmonary ROS   Pulmonary exam normal        Cardiovascular hypertension, Normal cardiovascular exam Rhythm:Regular  Gestational HTN   Neuro/Psych negative neurological ROS  negative psych ROS   GI/Hepatic Neg liver ROS,GERD  ,,  Endo/Other  Hypothyroidism    Renal/GU negative Renal ROS  negative genitourinary   Musculoskeletal negative musculoskeletal ROS (+)    Abdominal Normal abdominal exam  (+)   Peds  Hematology  (+) Blood dyscrasia, anemia   Anesthesia Other Findings   Reproductive/Obstetrics (+) Pregnancy Hx/o infertility                             Anesthesia Physical Anesthesia Plan  ASA: 2 and emergent  Anesthesia Plan: Epidural   Post-op Pain Management: Minimal or no pain anticipated and Regional block*   Induction: Intravenous  PONV Risk Score and Plan: Treatment may vary due to age or medical condition  Airway Management Planned: Natural Airway  Additional Equipment: None and Fetal Monitoring  Intra-op Plan:   Post-operative Plan:   Informed Consent: I have reviewed the patients History and Physical, chart, labs and discussed the procedure including the risks, benefits and alternatives for the proposed anesthesia with the patient or authorized representative who has indicated his/her understanding and acceptance.       Plan Discussed with: Anesthesiologist  Anesthesia Plan Comments: (Patient for urgent C/Section for non reassuring FHR tracing. Will use epidural for C/Section. M.Yvonnie Heritage, MD)       Anesthesia Quick Evaluation

## 2023-06-13 NOTE — Anesthesia Postprocedure Evaluation (Signed)
 Anesthesia Post Note  Patient: Renee Jacobs  Procedure(s) Performed: CESAREAN DELIVERY     Patient location during evaluation: PACU Anesthesia Type: Epidural Level of consciousness: oriented and awake and alert Pain management: pain level controlled Vital Signs Assessment: post-procedure vital signs reviewed and stable Respiratory status: spontaneous breathing, respiratory function stable and nonlabored ventilation Cardiovascular status: blood pressure returned to baseline and stable Postop Assessment: no headache, no backache, no apparent nausea or vomiting, patient able to bend at knees and epidural receding Anesthetic complications: no   No notable events documented.  Last Vitals:  Vitals:   06/13/23 1530 06/13/23 1545  BP: 122/88 129/89  Pulse: 88 86  Resp: 18 (!) 21  Temp:    SpO2: 97% 98%    Last Pain:  Vitals:   06/13/23 1546  TempSrc:   PainSc: 1    Pain Goal:    LLE Motor Response: Purposeful movement (06/13/23 1545) LLE Sensation: Numbness, Tingling (06/13/23 1545) RLE Motor Response: Purposeful movement (06/13/23 1545) RLE Sensation: Numbness, Tingling (06/13/23 1545)     Epidural/Spinal Function Cutaneous sensation: Tingles (06/13/23 1545), Patient able to flex knees: Yes (06/13/23 1545), Patient able to lift hips off bed: Yes (06/13/23 1545), Back pain beyond tenderness at insertion site: No (06/13/23 1545), Progressively worsening motor and/or sensory loss: No (06/13/23 1545), Bowel and/or bladder incontinence post epidural: No (06/13/23 1545)  Marwa Fuhrman A.

## 2023-06-14 LAB — CBC
HCT: 32.9 % — ABNORMAL LOW (ref 36.0–46.0)
Hemoglobin: 11.5 g/dL — ABNORMAL LOW (ref 12.0–15.0)
MCH: 33 pg (ref 26.0–34.0)
MCHC: 35 g/dL (ref 30.0–36.0)
MCV: 94.3 fL (ref 80.0–100.0)
Platelets: 192 10*3/uL (ref 150–400)
RBC: 3.49 MIL/uL — ABNORMAL LOW (ref 3.87–5.11)
RDW: 12.2 % (ref 11.5–15.5)
WBC: 18 10*3/uL — ABNORMAL HIGH (ref 4.0–10.5)
nRBC: 0 % (ref 0.0–0.2)

## 2023-06-14 MED ORDER — LEVOTHYROXINE SODIUM 50 MCG PO TABS
50.0000 ug | ORAL_TABLET | Freq: Every day | ORAL | Status: DC
  Administered 2023-06-16: 50 ug via ORAL
  Filled 2023-06-14 (×2): qty 1

## 2023-06-14 NOTE — Lactation Note (Signed)
 This note was copied from a baby's chart. Lactation Consultation Note  Patient Name: Renee Jacobs [id number] Date: 06/14/2023 Age:34 hours Reason for consult: Follow-up assessment;Primapara;1st time breastfeeding;Early term 37-38.6wks;Infant < 6lbs;Nipple pain/trauma;RN request  P1- RN asked LC to assist [name] because she was concerned over infant's feedings and [name]'s left nipple. [name] informed LC that she has a blister on the left nipple after she last pump. [name] informed LC that she turned the pump setting too high up. LC provided [name] with hydrogel pads to help heal it. LC encouraged [name] to keep the setting lower on the pump for now. LC also bottle fed infant while in there because it was time for his next feeding. LC placed infant in a side lying pace feed position. LC used a yellow hospital throw away nipple. Infant drank 20 mL with no spitting up, no gagging and no spilling milk. LC encouraged [name] to call for further assistance as needed.  Maternal Data Has patient been taught Hand Expression?: Yes Does the patient have breastfeeding experience prior to this delivery?: No  Feeding Mother's Current Feeding Choice: Breast Milk and Formula Nipple Type: Slow - flow  Lactation Tools Discussed/Used Tools: Pump;Flanges;Coconut oil;Comfort gels Flange Size: 18 Breast pump type: Double-Electric Breast Pump;Manual Pump Education: Setup, frequency, and cleaning;Milk Storage Pumped volume: 1 mL  Interventions Interventions: Breast feeding basics reviewed;Comfort gels;Education;Pace feeding;LC Services brochure  Discharge Discharge Education: Engorgement and breast care;Warning signs for feeding baby Pump: DEBP;Personal  Consult Status Consult Status: Follow-up Date: 06/15/23 Follow-up type: In-patient    Vernette Goo BS, IBCLC 06/14/2023, 9:56 PM

## 2023-06-14 NOTE — Lactation Note (Signed)
 This note was copied from a baby's chart. Lactation Consultation Note  Patient Name: Renee Jacobs [id number] Date: 06/14/2023 Age:34 hours Reason for consult: Follow-up assessment;Primapara;1st time breastfeeding;Early term 37-38.6wks;Infant < 6lbs;Infant weight loss;Maternal endocrine disorder (IVF pregnancy)  P1- Infant has had a weight loss of 6% today. Per [name], infant has been latching a little better today vs when this LC assisted with a feeding last night. [name] agreed to having the hospital DEBP set up for pumping vs her personal pump, so her night time RN set it up with 18 mm flanges. [name] reports that she had pumped only once and collected 1 mL. LC praised [name] for her collection. LC encouraged [name] to pump every 3 hrs to ensure proper stimulation to the breasts. [name] denies having any questions or concerns at this time. Infant had recently bottle fed before this consult, so LC was unable to assist with a feeding. LC reviewed latching infant first with every feeding, pumping every 3 hrs to ensure good breast stimulation, offer EBM and/or formula with every feeding to assist with weight loss, limit the breast feeding attempt to 15 minutes max, and engorgement/breast care. LC encouraged [name] to call for further assistance as needed.  Maternal Data Has patient been taught Hand Expression?: Yes Does the patient have breastfeeding experience prior to this delivery?: No  Feeding Mother's Current Feeding Choice: Breast Milk and Formula Nipple Type: Slow - flow  Lactation Tools Discussed/Used Tools: Pump;Flanges Flange Size: 18 Breast pump type: Double-Electric Breast Pump;Manual Pump Education: Setup, frequency, and cleaning;Milk Storage Reason for Pumping: LPI/low birth weight infant Pumping frequency: 15-20 min every 3 hrs  Interventions Interventions: Breast feeding basics reviewed;Hand pump;DEBP;Education;LC Services brochure;LPT handout/interventions  Discharge Discharge Education:  Engorgement and breast care;Warning signs for feeding baby Pump: DEBP;Personal  Consult Status Consult Status: Follow-up Date: 06/15/23 Follow-up type: In-patient    Renee Jacobs BS, IBCLC 06/14/2023, 4:31 PM

## 2023-06-14 NOTE — Progress Notes (Addendum)
 POSTPARTUM PROGRESS NOTE  Subjective: Renee Jacobs is a 33 y.o. G2P1011 s/p pLTCS at [redacted]w[redacted]d.  She reports she is doing well. No acute events overnight. She denies any problems with ambulating, voiding or po intake. Denies nausea or vomiting. She has passed flatus. Pain is well controlled.  Lochia is moderate.  Objective: Blood pressure 109/71, pulse 77, temperature 97.9 F (36.6 C), temperature source Oral, resp. rate 17, height 5\' 4"  (1.626 m), weight 59.9 kg, last menstrual period 09/15/2022, SpO2 97%, unknown if currently breastfeeding.  Physical Exam:  General: alert, cooperative and no distress Chest: no respiratory distress Abdomen: soft, non-tender  Uterine Fundus: firm and at level of umbilicus Extremities: No calf swelling or tenderness  Trace edema  Recent Labs    06/13/23 1825 06/14/23 0456  HGB 12.2 11.5*  HCT 35.0* 32.9*    Assessment/Plan: Renee Jacobs is a 33 y.o. G2P1011 s/p pLTCS at [redacted]w[redacted]d for NRFHT remote from delivery.  Routine Postpartum Care: Doing well, pain well-controlled.  -- Continue routine care, lactation support  -- Contraception: Condoms -- Feeding: breast  #gHTN: Normotensive PP. Continue Lasix /K #Hypothyroidism: Continue home synthroid  #Avoids pork products. SCDs in place vs Lovenox for DVT ppx  Dispo: Plan for discharge 4/27-4/28.  Maud Sorenson, MD OB Fellow 06/14/2023 12:37 PM

## 2023-06-15 MED ORDER — FERROUS SULFATE 325 (65 FE) MG PO TABS
325.0000 mg | ORAL_TABLET | ORAL | Status: DC
  Administered 2023-06-15: 325 mg via ORAL
  Filled 2023-06-15: qty 1

## 2023-06-15 MED ORDER — FUROSEMIDE 20 MG PO TABS
40.0000 mg | ORAL_TABLET | Freq: Every day | ORAL | Status: DC
Start: 2023-06-15 — End: 2023-06-18
  Administered 2023-06-15 – 2023-06-16 (×2): 40 mg via ORAL
  Filled 2023-06-15 (×2): qty 2

## 2023-06-15 NOTE — Lactation Note (Signed)
 This note was copied from a baby's chart. Lactation Consultation Note  Patient Name: Renee Jacobs [id number] Date: 06/15/2023 Age:34 hours Reason for consult: Follow-up assessment;Primapara;1st time breastfeeding;Early term 37-38.6wks;Infant < 6lbs;Maternal endocrine disorder  P1- [name] reports that she is still not latching infant and just focusing on pumping, feeding EBM and offering formula (until her milk comes in). Infant has has a total weight loss of 3.13% as of today. [name] reports that infant is feeding much better on the bottle today. The most infant has taken in 42 mL. LC praised [name] and infant. [name] also reported that her blister on the left nipple had went away after using the hydrogel pad for one night. While talking with [name], LC noted that [name] had the Medela 18 mm flange shoved into the 24 mm flange of [name]'s home pump. LC informed [name] that this will make the pump not work. LC asked [name] if she had purchased the flange inserts that were previously discussed. [name] stated that she thought doing this was the same as the flange inserts. LC used her phone to show [name] the flange inserts that were previously discussed. [name] confirmed that she would purchase them.   [name] expressed her confusion and concern over why they are staying another night in the hospital. [name] informed LC that she was expecting the pediatric MD to come to their room to discuss these things with them. LC informed [name] that I was unsure if there was a specific reason, but that I would tell the NP about her concerns. LC informed the pediatrician of [name]'s concerns. LC encouraged [name] to call for further assistance as needed.  Maternal Data Has patient been taught Hand Expression?: Yes Does the patient have breastfeeding experience prior to this delivery?: No  Feeding Mother's Current Feeding Choice: Breast Milk and Formula  Lactation Tools Discussed/Used Tools: Pump;Flanges;Comfort gels Flange Size: 18 Breast pump type:  Double-Electric Breast Pump;Manual Pump Education: Setup, frequency, and cleaning;Milk Storage Reason for Pumping: [name] request, infant low birth weight Pumping frequency: 15-20 min every 3 hrs  Interventions Interventions: Breast feeding basics reviewed;Comfort gels;Hand pump;DEBP;Education;LC Services brochure  Discharge Discharge Education: Engorgement and breast care;Warning signs for feeding baby Pump: DEBP;Personal  Consult Status Consult Status: Follow-up Date: 06/16/23 Follow-up type: In-patient    Vernette Goo BS, IBCLC 06/15/2023, 10:06 PM

## 2023-06-15 NOTE — Progress Notes (Signed)
 Rn called MD regarding the patient's slightly elevated blood pressures. 120/87 and 134/89. The MD will write an order to increase lasix .

## 2023-06-15 NOTE — Progress Notes (Signed)
 POSTPARTUM PROGRESS NOTE  POD #2  Subjective:  Renee Jacobs is a 33 y.o. G2P1011 s/p primary LTCS at [redacted]w[redacted]d. No acute events overnight. She reports she is doing well. She denies any problems with ambulating, voiding or po intake. Denies nausea or vomiting. She has passed flatus. Pain is well controlled.  Lochia is equivalent to a period.  Objective: Blood pressure 134/89, pulse (!) 50, temperature (!) 97.5 F (36.4 C), temperature source Oral, resp. rate 18, height 5\' 4"  (1.626 m), weight 59.9 kg, last menstrual period 09/15/2022, SpO2 97%, unknown if currently breastfeeding.  Physical Exam:  General: alert, cooperative and no distress Chest: no respiratory distress Heart: regular rate, distal pulses intact Uterine Fundus: firm, appropriately tender DVT Evaluation: No calf swelling or tenderness Extremities: 1+ bilateral nonpitting edema Skin: warm, dry; incision clean/dry/intact w/ honeycomb dressing in place  Recent Labs    06/13/23 1825 06/14/23 0456  HGB 12.2 11.5*  HCT 35.0* 32.9*    Assessment/Plan: Renee Jacobs is a 33 y.o. G2P1011 s/p pLTCS at [redacted]w[redacted]d for fetal intolerance to labor.  POD#2 - Doing welll; pain well controlled. H/H appropriate  Routine postpartum care  OOB, ambulated  Lovenox for VTE prophylaxis  Acute Postoperative Blood Loss Anemia: asymptomatic  Start po ferrous sulfate every other day  Gestational Hypertension: Diastolic BP still not at goal -- >11mmHg, also having increased swelling bilaterally, so will increase Lasix  and monitor BP throughout the day, no si/sx PEC  Contraception: condoms Feeding: breast  Dispo: Plan for discharge tomorrow.   LOS: 3 days   Melanie Spires, MD OB Fellow  06/15/2023, 7:19 AM

## 2023-06-15 NOTE — Plan of Care (Signed)
   Problem: Education: Goal: Knowledge of General Education information will improve Description: Including pain rating scale, medication(s)/side effects and non-pharmacologic comfort measures Outcome: Completed/Met

## 2023-06-16 ENCOUNTER — Other Ambulatory Visit (HOSPITAL_COMMUNITY): Payer: Self-pay

## 2023-06-16 ENCOUNTER — Encounter (HOSPITAL_COMMUNITY): Payer: Self-pay | Admitting: Obstetrics and Gynecology

## 2023-06-16 ENCOUNTER — Inpatient Hospital Stay (HOSPITAL_COMMUNITY): Admission: RE | Admit: 2023-06-16 | Payer: BC Managed Care – PPO | Source: Home / Self Care | Admitting: Family Medicine

## 2023-06-16 ENCOUNTER — Inpatient Hospital Stay (HOSPITAL_COMMUNITY)

## 2023-06-16 MED ORDER — FERROUS SULFATE 325 (65 FE) MG PO TABS
325.0000 mg | ORAL_TABLET | ORAL | 3 refills | Status: DC
  Filled 2023-06-16: qty 90, 180d supply, fill #0

## 2023-06-16 MED ORDER — FUROSEMIDE 40 MG PO TABS: 40.0000 mg | ORAL_TABLET | Freq: Every day | ORAL | 0 refills | Status: DC

## 2023-06-16 MED ORDER — POTASSIUM CHLORIDE CRYS ER 20 MEQ PO TBCR
40.0000 meq | EXTENDED_RELEASE_TABLET | Freq: Every day | ORAL | 0 refills | Status: DC
  Filled 2023-06-16: qty 6, 3d supply, fill #0
  Filled 2023-06-16: qty 12, 6d supply, fill #0

## 2023-06-16 MED ORDER — POLYETHYLENE GLYCOL 3350 17 G PO PACK: 17.0000 g | PACK | ORAL | 0 refills | Status: DC

## 2023-06-16 MED ORDER — POTASSIUM CHLORIDE CRYS ER 20 MEQ PO TBCR: 40.0000 meq | EXTENDED_RELEASE_TABLET | Freq: Every day | ORAL | 0 refills | Status: DC

## 2023-06-16 MED ORDER — OXYCODONE HCL 5 MG PO TABS
5.0000 mg | ORAL_TABLET | Freq: Four times a day (QID) | ORAL | 0 refills | Status: DC | PRN
  Filled 2023-06-16: qty 10, 3d supply, fill #0

## 2023-06-16 MED ORDER — FUROSEMIDE 40 MG PO TABS
40.0000 mg | ORAL_TABLET | Freq: Every day | ORAL | 0 refills | Status: DC
  Filled 2023-06-16: qty 6, 6d supply, fill #0

## 2023-06-16 MED ORDER — POLYETHYLENE GLYCOL 3350 17 GM/SCOOP PO POWD
17.0000 g | ORAL | 0 refills | Status: AC
  Filled 2023-06-16: qty 238, 28d supply, fill #0

## 2023-06-16 MED ORDER — FERROUS SULFATE 325 (65 FE) MG PO TABS: 325.0000 mg | ORAL_TABLET | ORAL | 3 refills | Status: DC

## 2023-06-16 MED ORDER — IBUPROFEN 600 MG PO TABS: 600.0000 mg | ORAL_TABLET | Freq: Four times a day (QID) | ORAL | 0 refills | Status: DC

## 2023-06-16 MED ORDER — OXYCODONE HCL 5 MG PO TABS: 5.0000 mg | ORAL_TABLET | Freq: Four times a day (QID) | ORAL | 0 refills | Status: DC | PRN

## 2023-06-17 ENCOUNTER — Other Ambulatory Visit (HOSPITAL_COMMUNITY): Payer: Self-pay

## 2023-06-17 LAB — SURGICAL PATHOLOGY

## 2023-06-19 ENCOUNTER — Encounter: Payer: BC Managed Care – PPO | Admitting: Obstetrics and Gynecology

## 2023-06-20 ENCOUNTER — Ambulatory Visit

## 2023-06-20 DIAGNOSIS — Z0130 Encounter for examination of blood pressure without abnormal findings: Secondary | ICD-10-CM

## 2023-06-20 NOTE — Progress Notes (Signed)
 Subjective:  Renee Jacobs is a 33 y.o. female here for BP check and incision check.  Hypertension ROS: taking medications as instructed, no medication side effects noted, no TIA's, no chest pain on exertion, no dyspnea on exertion, and no swelling of ankles.   Pt reports some soreness, incision covered  Objective:  BP 124/87   Pulse 80   Ht 5\' 4"  (1.626 m)   Wt 117 lb (53.1 kg)   LMP 09/15/2022   Breastfeeding Yes   BMI 20.08 kg/m   Appearance alert, well appearing, and in no distress. Incision healing well.   Assessment:   Blood Pressure today in office well controlled.  Honeycomb dressing and steri strips removed. Incision healed nicely, no signs of infection. Dr. Adriana Hopping reviewed.  Plan:  Current treatment plan is effective, no change in therapy. Return precautions reviewed. Follow up at postpartum appointment.   Evelia Hipp, RN

## 2023-06-27 IMAGING — US US OB < 14 WEEKS - US OB TV
1 series · 15 of 28 positions shown · non-contrast
Comparison: None.

CLINICAL DATA: Vaginal bleeding

EXAM:
OBSTETRIC <14 WK ULTRASOUND
TECHNIQUE: Transabdominal ultrasound was performed for evaluation of the
gestation as well as the maternal uterus and adnexal regions.

[Series 1: us ob < 14 weeks - us ob tv · 15 of 50 slices shown]
[im 1/50]
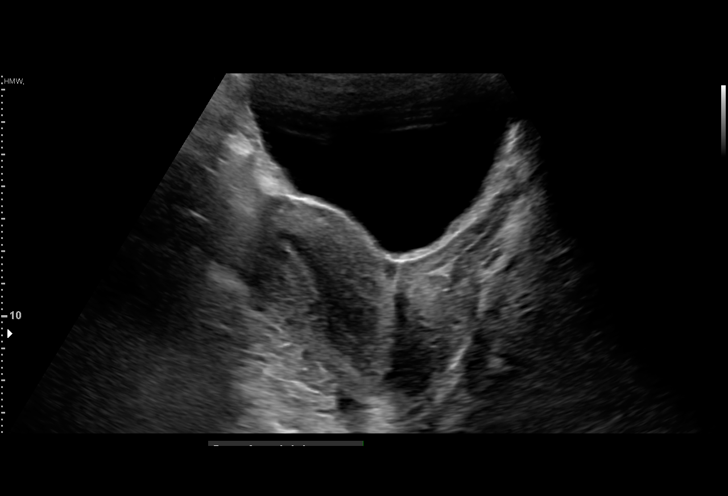
[im 4/50]
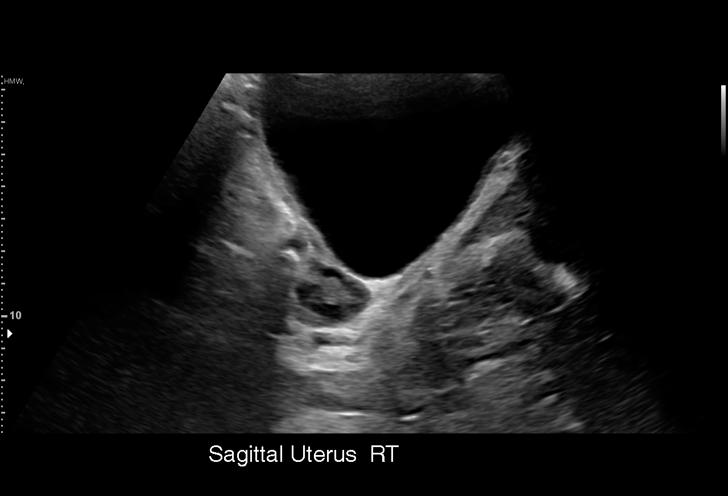
[im 8/50]
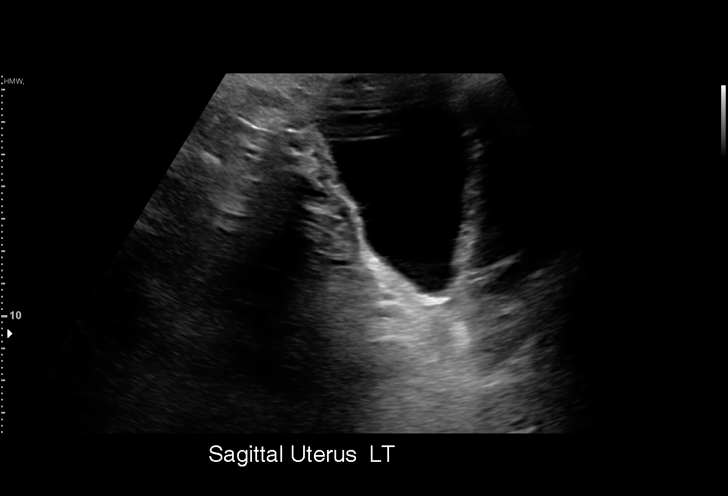
[im 11/50]
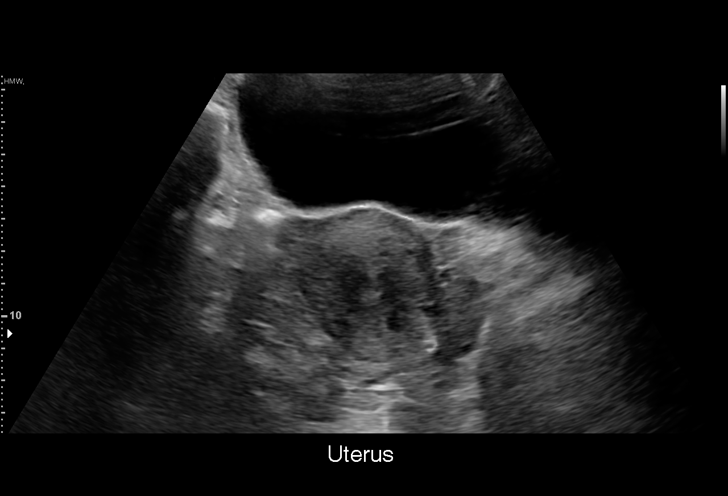
[im 15/50]
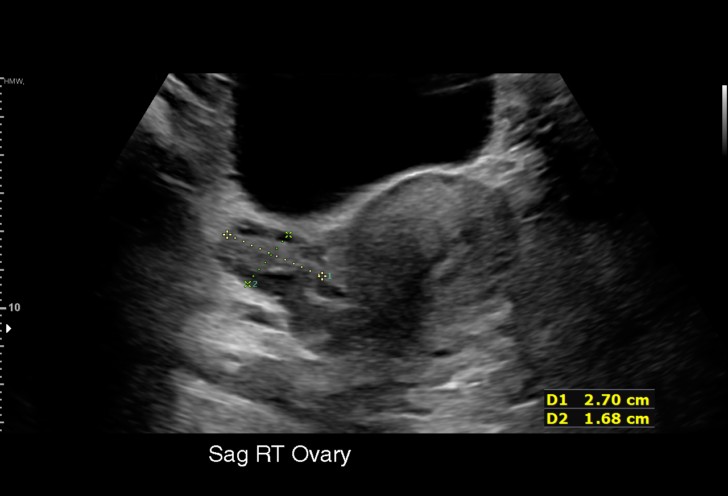
[im 19/50]
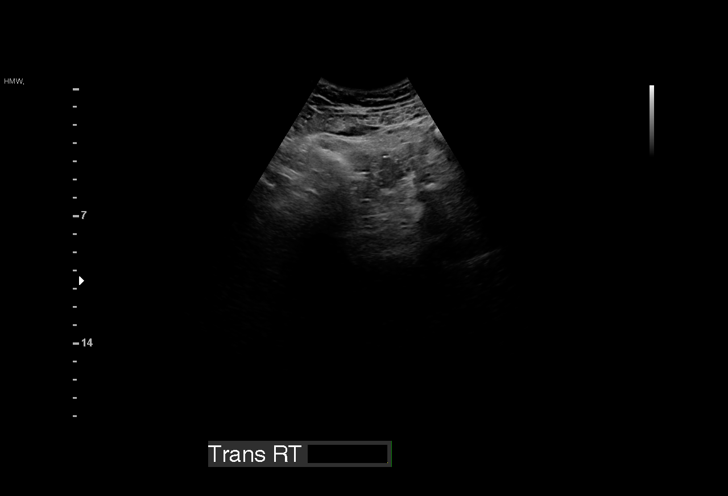
[im 22/50]
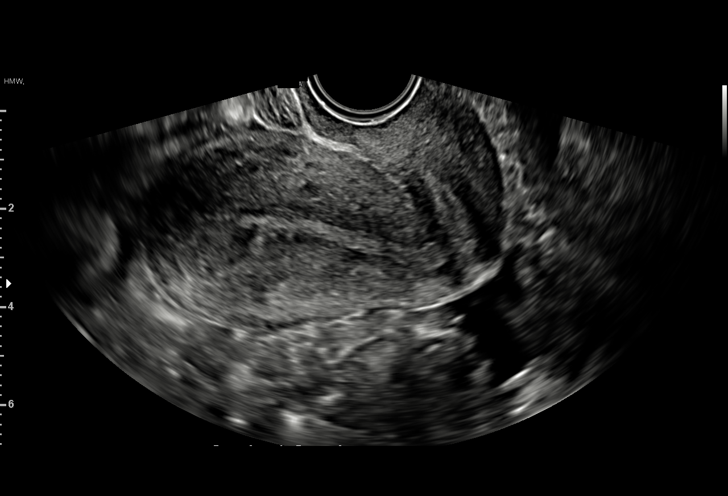
[im 26/50]
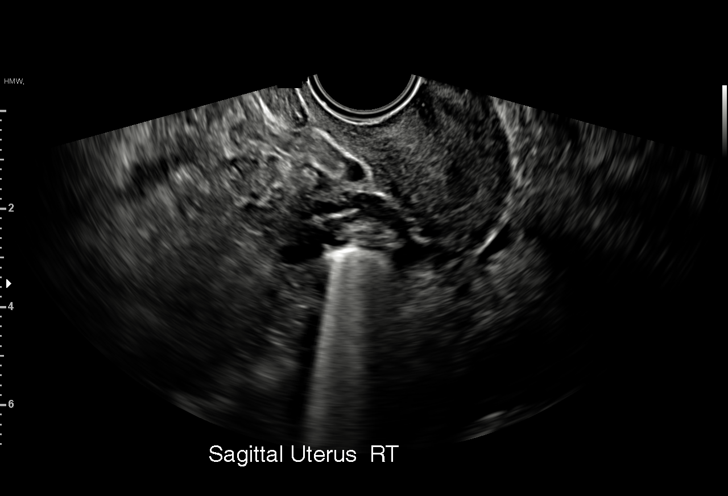
[im 28/50]
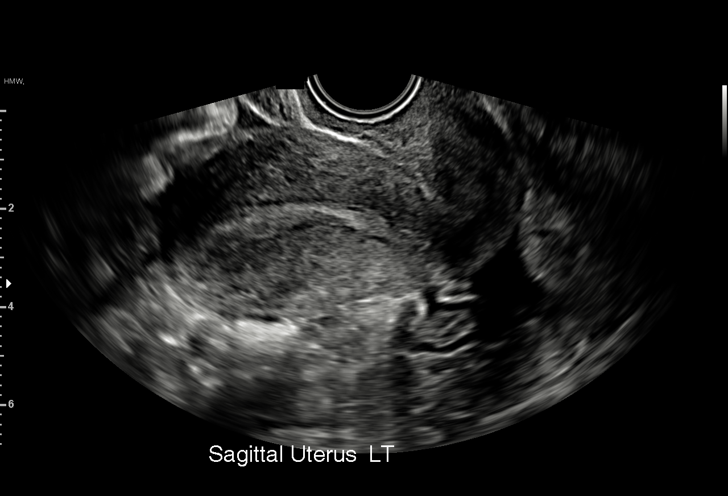
[im 31/50]
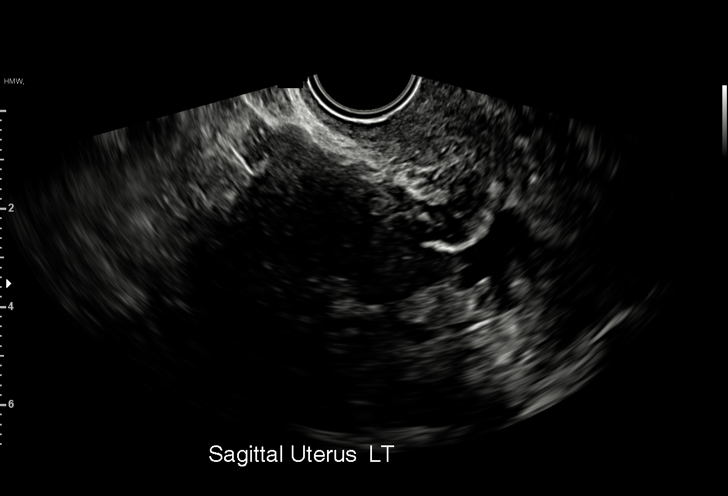
[im 35/50]
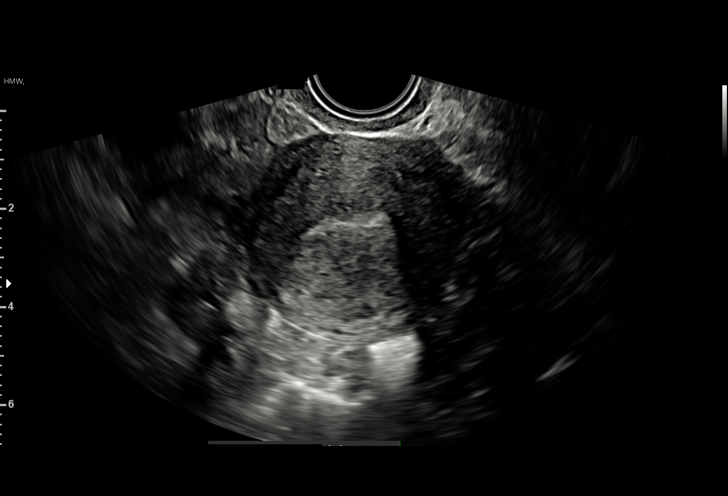
[im 39/50]
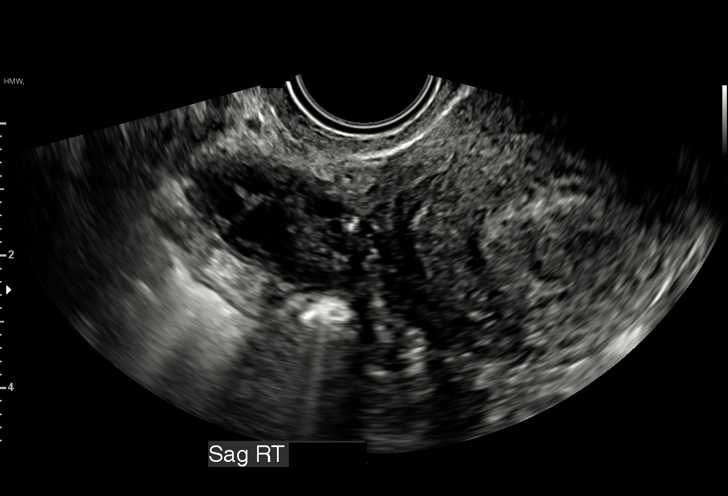
[im 42/50]
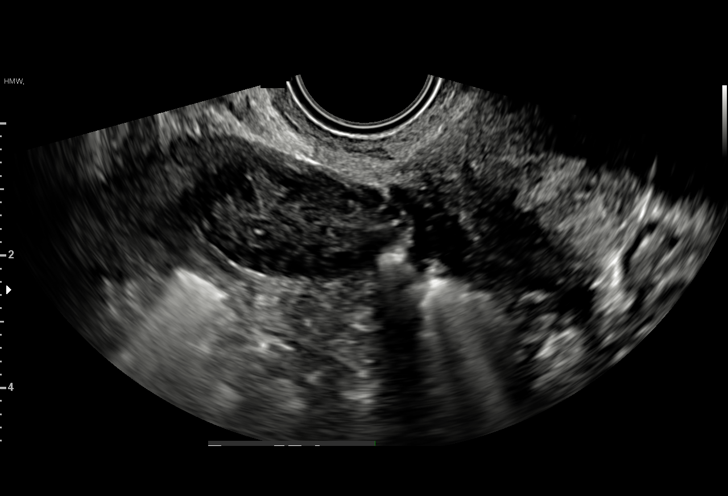
[im 46/50]
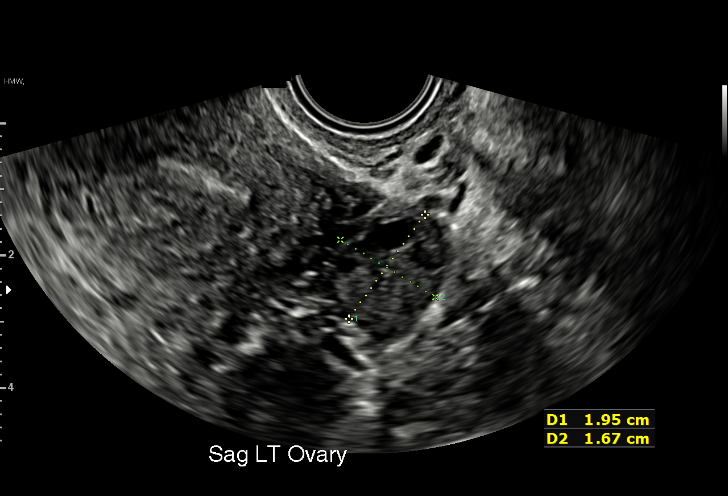
[im 50/50]
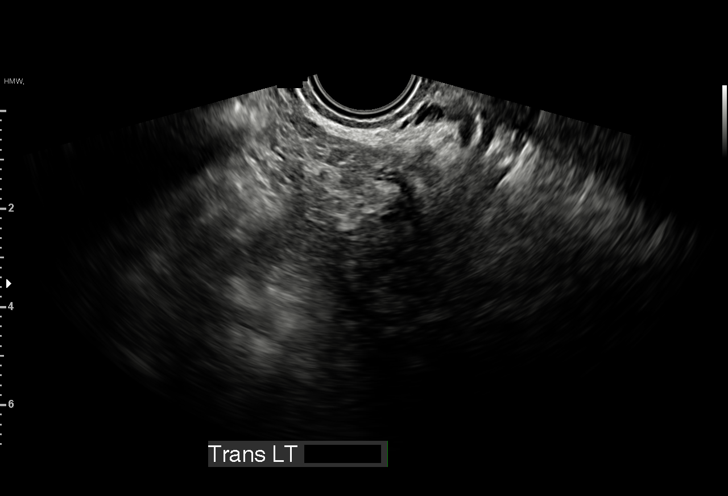

[15 of 28 positions shown; findings below may reference images not displayed]

FINDINGS: Intrauterine gestational sac: None

Yolk sac:  Not Visualized.

Embryo:  Not Visualized.

Cardiac Activity: Not Visualized.

Heart Rate: Not visualized.

Maternal uterus/adnexae: Endometrium 4. Trace, nonspecific free
fluid in the low pelvis.
IMPRESSION: No candidate intrauterine gestation identified by ultrasound. Early
pregnancy of unknown location. Recommend ectopic precautions, serial
beta HCG, and consider follow-up imaging in 7-14 days.

## 2023-07-07 ENCOUNTER — Telehealth: Payer: Self-pay | Admitting: *Deleted

## 2023-07-07 NOTE — Telephone Encounter (Signed)
 Received a voice message this am from [place], RN Case manager with [facility] specializing in Maternity. States she has been trying to reach this patient to notify her of her case management / maternity support. States since she did not reach her per their protocol they reach out to provide to leave this information so if patient has needs and to encourage patient at next ob appointment to call case manager back. Per chart review this is a [facility] patient, will forward message to that office. Alejandra Hurst

## 2023-07-23 ENCOUNTER — Ambulatory Visit: Admitting: Obstetrics and Gynecology

## 2023-07-23 ENCOUNTER — Encounter: Payer: Self-pay | Admitting: Obstetrics and Gynecology

## 2023-07-23 DIAGNOSIS — Z98891 History of uterine scar from previous surgery: Secondary | ICD-10-CM | POA: Diagnosis not present

## 2023-07-23 DIAGNOSIS — Z8759 Personal history of other complications of pregnancy, childbirth and the puerperium: Secondary | ICD-10-CM | POA: Insufficient documentation

## 2023-07-23 NOTE — Progress Notes (Signed)
    Post Partum Visit Note  Renee Jacobs is a 33 y.o. G57P1011 female who presents for a postpartum visit. She is 5 weeks postpartum following a c-section.  I have fully reviewed the prenatal and intrapartum course. The delivery was at 38 gestational weeks.  Anesthesia: epidural. Postpartum course has been good. Baby is doing well. Baby is feeding by both breast and bottle - enfamil gentle. Bleeding pink. Bowel function is normal. Bladder function is normal. Patient is not sexually active. Contraception method is condoms. Postpartum depression screening: negative.   The pregnancy intention screening data noted above was reviewed. Potential methods of contraception were discussed. The patient elected to proceed with No data recorded.    Health Maintenance Due  Topic Date Due   Cervical Cancer Screening (HPV/Pap Cotest)  10/05/2022   COVID-19 Vaccine (1 - 2024-25 season) Never done    The following portions of the patient's history were reviewed and updated as appropriate: allergies, current medications, past family history, past medical history, past social history, past surgical history, and problem list.  Review of Systems Pertinent items are noted in HPI.  Objective:  LMP 09/15/2022    General:  alert and cooperative  Lungs: clear to auscultation bilaterally  Heart:  regular rate and rhythm, S1, S2 normal, no murmur, click, rub or gallop  Abdomen: Soft, nontender   Wound well approximated incision       Assessment:   1. Postpartum care and examination (Primary) 5 week postpartum exam.   Plan:   Essential components of care per ACOG recommendations:  1.  Mood and well being: Patient with negative depression screening today. Reviewed local resources for support.  - Patient tobacco use? No.   - hx of drug use? No.    2. Infant care and feeding:  -Patient currently breastmilk feeding? Yes. Reviewed importance of draining breast regularly to support lactation.  -Social  determinants of health (SDOH) reviewed in EPIC. No concerns identified.  3. Sexuality, contraception and birth spacing - Patient does not want a pregnancy in the next year.  Desired family size is 2 children. Wants to try for second child when this one is around 90 years old. Interested in VBAC next time. - Reviewed reproductive life planning. Reviewed contraceptive methods based on pt preferences and effectiveness.  Patient desired Female Condom today.   - Discussed birth spacing of 18 months  4. Sleep and fatigue -Encouraged family/partner/community support of 4 hrs of uninterrupted sleep to help with mood and fatigue  5. Physical Recovery  - Discussed patients delivery and complications. She describes her labor as good. - Patient had a C-section emergent. Incision appears to be healing well, no signs of infection - Patient has urinary incontinence? No. - Patient is safe to resume physical and sexual activity  6.  Health Maintenance - HM due items addressed Yes - Last pap smear August 2021. Patient will return in 6 months for pap smear follow up. -Breast Cancer screening indicated? No.   7. Chronic Disease/Pregnancy Condition follow up: None  - PCP follow up   Doria Garden, [facility] 07/23/23 Center for [employer], [facility]

## 2023-07-24 ENCOUNTER — Encounter: Payer: Self-pay | Admitting: Obstetrics and Gynecology

## 2023-08-21 ENCOUNTER — Encounter: Payer: Self-pay | Admitting: "Endocrinology

## 2023-08-21 ENCOUNTER — Telehealth (INDEPENDENT_AMBULATORY_CARE_PROVIDER_SITE_OTHER): Admitting: "Endocrinology

## 2023-08-21 DIAGNOSIS — E038 Other specified hypothyroidism: Secondary | ICD-10-CM | POA: Diagnosis not present

## 2023-08-21 NOTE — Progress Notes (Signed)
 The patient reports they are currently: Renee Jacobs. I spent 7-8 minutes on the video with the patient on the date of service. I spent an additional 4-5 minutes on pre- and post-visit activities on the date of service.   The patient was physically located in [place]  or a state in which I am permitted to provide care. The patient and/or parent/guardian understood that s/he may incur co-pays and cost sharing, and agreed to the telemedicine visit. The visit was reasonable and appropriate under the circumstances given the patient's presentation at the time.  The patient and/or parent/guardian has been advised of the potential risks and limitations of this mode of treatment (including, but not limited to, the absence of in-person examination) and has agreed to be treated using telemedicine. The patient's/patient's family's questions regarding telemedicine have been answered.   The patient and/or parent/guardian has also been advised to contact their provider's office for worsening conditions, and seek emergency medical treatment and/or call 911 if the patient deems either necessary.     Outpatient Endocrinology Note Obadiah Birmingham, MD  08/21/23   Renee Jacobs Aug 04, 1990 [id number]  Referring Provider: No ref. provider found Primary Care Provider: Pcp, No Subjective  No chief complaint on file.   Assessment & Plan  There are no diagnoses linked to this encounter.  Renee Jacobs is currently taking levothyroxine  50 mcg every day, skipping sundays . Started on 09/27/22 to help assist with ongoing IVF induced pregnancy. Patient stopped it after delivery. Has been asymptomatic.  Educated on thyroid  axis.  Recommend the following: labs ordered.  Repeat labs before next visit.  Repeat lab before next visit or sooner if symptoms of hyperthyroidism or hypothyroidism develop.  Notify us  immediately in case of pregnancy/breastfeeding or significant weight gain or loss. Counseled on compliance and  follow up needs.  I have reviewed current medications, nurse's notes, allergies, vital signs, past medical and surgical history, family medical history, and social history for this encounter. Counseled patient on symptoms, examination findings, lab findings, imaging results, treatment decisions and monitoring and prognosis. The patient understood the recommendations and agrees with the treatment plan. All questions regarding treatment plan were fully answered.   No follow-ups on file.   Obadiah Birmingham, MD  08/21/23   I have reviewed current medications, nurse's notes, allergies, vital signs, past medical and surgical history, family medical history, and social history for this encounter. Counseled patient on symptoms, examination findings, lab findings, imaging results, treatment decisions and monitoring and prognosis. The patient understood the recommendations and agrees with the treatment plan. All questions regarding treatment plan were fully answered.   History of Present Illness Renee Jacobs is a 33 y.o. year old female who presents to our clinic with subclinical hypothyroidism diagnosed in 2023.    Patient delivered 05/2023  Stopped levothyroxine  50 mcg 6 days/week, self stopped after delivery  Denies fatigue/bowel issues/cold or heat intolerance   Compressive symptoms:  dysphagia  No dysphonia  No positional dyspnea (especially with simultaneous arms elevation)  No  Initial history:  Highest TSH off of levothyroxine  per pt was in 6.   Started attempts on conceiving in Nov 2022 Had a miscarriage at 6 weeks in 02/2021 On fertility treatment in 01/2022 with [facility] in [place]  She started IVF treatment since 05/2022  Symptoms suggestive of HYPOTHYROIDISM:  fatigue No Cold/hot intolerance  No constipation Yes             Smokes  No On biotin  No Personal history  of head/neck surgery/irradiation  No   Physical Exam  There were no vitals taken for  this visit. Constitutional: well developed, well nourished Head: normocephalic, atraumatic, no exophthalmos Eyes: sclera anicteric, no redness Neck: no thyromegaly, no thyroid  tenderness; no nodules palpated Lungs: normal respiratory effort Neurology: alert and oriented, no fine hand tremor Skin: dry, no appreciable rashes Musculoskeletal: no appreciable defects Psychiatric: normal mood and affect  Allergies Allergies  Allergen Reactions   Beef-Derived Drug Products    Fish-Derived Products    Pork-Derived Products     Current Medications Patient's Medications  New Prescriptions   No medications on file  Previous Medications   ACETAMINOPHEN  (TYLENOL ) 500 MG TABLET    Take 500 mg by mouth every 6 (six) hours as needed.   FERROUS SULFATE  325 (65 FE) MG TABLET    Take 1 tablet (325 mg total) by mouth every other day.   FUROSEMIDE  (LASIX ) 40 MG TABLET    Take 1 tablet (40 mg total) by mouth daily.   IBUPROFEN  (ADVIL ) 600 MG TABLET    Take 1 tablet (600 mg total) by mouth every 6 (six) hours.   LEVOTHYROXINE  (SYNTHROID ) 50 MCG TABLET    Take 1 tablet (50 mcg total) by mouth daily.   OMEGA-3 FATTY ACIDS (FISH OIL) 1000 MG CAPS    Take 1,000 mg by mouth daily.   ONDANSETRON  (ZOFRAN -ODT) 8 MG DISINTEGRATING TABLET    Take 0.5 tablets (4 mg total) by mouth every 8 (eight) hours as needed for nausea.   OXYCODONE  (OXY IR/ROXICODONE ) 5 MG IMMEDIATE RELEASE TABLET    Take 1 tablet (5 mg total) by mouth every 6 (six) hours as needed for severe pain (pain score 7-10) or breakthrough pain.   POLYETHYLENE GLYCOL POWDER (GLYCOLAX /MIRALAX ) 17 GM/SCOOP POWDER    Take 17 g by mouth every other day.   POTASSIUM CHLORIDE  SA (KLOR-CON  M) 20 MEQ TABLET    Take 2 tablets (40 mEq total) by mouth daily.   PRENATAL VIT-FE FUMARATE-FA (PRENATAL VITAMIN PO)    Take by mouth.  Modified Medications   No medications on file  Discontinued Medications   No medications on file    Past Medical History Past  Medical History:  Diagnosis Date   Anemia    Hypothyroidism    Infertility management     Past Surgical History Past Surgical History:  Procedure Laterality Date   CESAREAN SECTION N/A 06/13/2023   Procedure: CESAREAN DELIVERY;  Surgeon: Izell Harari, MD;  Location: [facility] LD ORS;  Service: Obstetrics;  Laterality: N/A;   MOUTH SURGERY  04/2020   OTHER SURGICAL HISTORY     surg/procedures associated with IVF (egg retrieval, embryo transfer, etc)    Family History family history includes Healthy in her mother; Hyperlipidemia in her father; Hypertension in her father; Ovarian cysts in her mother.  Social History Social History   Socioeconomic History   Marital status: Married    Spouse name: Not on file   Number of children: Not on file   Years of education: Not on file   Highest education level: Not on file  Occupational History   Not on file  Tobacco Use   Smoking status: Never   Smokeless tobacco: Never  Vaping Use   Vaping status: Never Used  Substance and Sexual Activity   Alcohol use: Not Currently    Comment: occ   Drug use: Never   Sexual activity: Not Currently  Other Topics Concern   Not on file  Social History  Narrative   Not on file   Social Drivers of Health   Financial Resource Strain: Not on file  Food Insecurity: No Food Insecurity (06/12/2023)   Hunger Vital Sign    Worried About Running Out of Food in the Last Year: Never true    Ran Out of Food in the Last Year: Never true  Transportation Needs: No Transportation Needs (06/12/2023)   PRAPARE - Administrator, Civil Service (Medical): No    Lack of Transportation (Non-Medical): No  Physical Activity: Not on file  Stress: Not on file  Social Connections: Not on file  Intimate Partner Violence: Not At Risk (06/12/2023)   Humiliation, Afraid, Rape, and Kick questionnaire    Fear of Current or Ex-Partner: No    Emotionally Abused: No    Physically Abused: No    Sexually Abused: No     Laboratory Investigations Lab Results  Component Value Date   TSH 1.760 05/01/2023   TSH 1.350 03/06/2023   TSH 1.590 02/06/2023   FREET4 1.08 03/06/2023   FREET4 1.05 02/06/2023   FREET4 1.26 10/10/2022     No results found for: TSI   No components found for: TRAB   No results found for: CHOL No results found for: HDL No results found for: LDLCALC No results found for: TRIG No results found for: [facility] Lab Results  Component Value Date   CREATININE 0.63 06/12/2023   No results found for: GFR    Component Value Date/Time   NA 136 06/12/2023 2036   K 4.2 06/12/2023 2036   CL 107 06/12/2023 2036   CO2 20 (L) 06/12/2023 2036   GLUCOSE 108 (H) 06/12/2023 2036   BUN 10 06/12/2023 2036   CREATININE 0.63 06/12/2023 2036   CALCIUM 9.2 06/12/2023 2036   PROT 6.4 (L) 06/12/2023 2036   ALBUMIN 2.7 (L) 06/12/2023 2036   AST 37 06/12/2023 2036   ALT 40 06/12/2023 2036   ALKPHOS 127 (H) 06/12/2023 2036   BILITOT 0.5 06/12/2023 2036   GFRNONAA >60 06/12/2023 2036      Latest Ref Rng & Units 06/12/2023    8:36 PM 06/07/2023   11:39 AM  BMP  Glucose 70 - 99 mg/dL 891  80   BUN 6 - 20 mg/dL 10  9   Creatinine 9.55 - 1.00 mg/dL 9.36  9.36   Sodium 864 - 145 mmol/L 136  132   Potassium 3.5 - 5.1 mmol/L 4.2  4.0   Chloride 98 - 111 mmol/L 107  103   CO2 22 - 32 mmol/L 20  21   Calcium 8.9 - 10.3 mg/dL 9.2  9.0        Component Value Date/Time   WBC 18.0 (H) 06/14/2023 0456   RBC 3.49 (L) 06/14/2023 0456   HGB 11.5 (L) 06/14/2023 0456   HGB 13.4 04/03/2023 0903   HCT 32.9 (L) 06/14/2023 0456   HCT 38.1 04/03/2023 0903   PLT 192 06/14/2023 0456   PLT 193 04/03/2023 0903   MCV 94.3 06/14/2023 0456   MCV 94 04/03/2023 0903   MCH 33.0 06/14/2023 0456   MCHC 35.0 06/14/2023 0456   RDW 12.2 06/14/2023 0456   RDW 11.3 (L) 04/03/2023 0903   LYMPHSABS 1.3 12/11/2022 1013   EOSABS 0.0 12/11/2022 1013   BASOSABS 0.0 12/11/2022 1013      Parts of this  note may have been dictated using voice recognition software. There may be variances in spelling and vocabulary which are unintentional.  Not all errors are proofread. Please notify the dino if any discrepancies are noted or if the meaning of any statement is not clear.

## 2023-08-28 ENCOUNTER — Telehealth: Admitting: "Endocrinology

## 2023-09-12 ENCOUNTER — Other Ambulatory Visit

## 2023-09-12 DIAGNOSIS — E038 Other specified hypothyroidism: Secondary | ICD-10-CM | POA: Diagnosis not present

## 2023-09-12 LAB — TSH+FREE T4: TSH W/REFLEX TO FT4: 3.15 m[IU]/L

## 2023-12-15 ENCOUNTER — Encounter: Payer: Self-pay | Admitting: Obstetrics and Gynecology

## 2024-01-21 NOTE — Progress Notes (Unsigned)
   ANNUAL EXAM Patient name: Renee Jacobs MRN [id number]  Date of birth: 10/21/1990 Chief Complaint:   No chief complaint on file.  History of Present Illness:   Renee Jacobs is a 33 y.o. G6P1011 female being seen today for a routine annual exam.   Current concerns: ***  Current birth control: ***  No LMP recorded.   Gardasil: *** Last Pap/Pap History:  H/O abnormal pap: no 09/2019: pap/HPV wnl  Review of Systems:   Pertinent items are noted in HPI Denies any headaches, blurred vision, fatigue, shortness of breath, chest pain, abdominal pain, abnormal vaginal discharge/itching/odor/irritation, problems with periods, bowel movements, urination, or intercourse unless otherwise stated above. *** Pertinent History Reviewed:  Reviewed past medical,surgical, social and family history.  Reviewed problem list, medications and allergies. Physical Assessment:  There were no vitals filed for this visit.There is no height or weight on file to calculate BMI.   Physical Examination:  General appearance - well appearing, and in no distress Mental status - alert, oriented to person, place, and time Psych:  She has a normal mood and affect Skin - warm and dry, normal color, no suspicious lesions noted Chest - effort normal Heart - normal rate  Breasts - breasts appear normal, no suspicious masses, no skin or nipple changes or axillary nodes Abdomen - soft, nontender, nondistended, no masses or organomegaly Pelvic -  {Blank single:19197::Performed and:,declines,not indicated} VULVA: {Blank single:19197::Not examined,normal appearing vulva with no masses, tenderness or lesions,***} VAGINA: {Blank single:19197::Not examined,normal appearing vagina with normal color and discharge, no lesions,***} CERVIX: {Blank single:19197::Not examined,normal appearing cervix without discharge or lesions, no CMT.,***} UTERUS: {Blank single:19197::Not examined,uterus is felt to be  normal size, shape, consistency and nontender,***} ADNEXA: {Blank single:19197::Not examined,No adnexal masses or tenderness noted,***} Extremities:  No swelling or varicosities noted  Chaperone present for exam  No results found for this or any previous visit (from the past 24 hours).  Assessment & Plan:  Diagnoses and all orders for this visit:  Encounter for annual routine gynecological examination  - Cervical cancer screening: Discussed guidelines. Pap with HPV done - Gardasil: {Blank single:19197::***,has not yet had. Will provide information,completed,has not yet had. Counseling provided and she declines,Has not yet had. Counseling provided and pt accepts} - GC/CT: {Blank single:19197::accepts,declines,not indicated} - HIV/RPR/HCV testing: {Blank single:19197::accepts,declines,not indicated} - Birth Control: {Birth control type:34026} - Breast Health: Encouraged self breast awareness/SBE. Teaching provided.  - F/U 12 months and prn     No orders of the defined types were placed in this encounter.   Meds: No orders of the defined types were placed in this encounter.   Follow-up: No follow-ups on file.  Vina Solian, MD 01/21/2024 1:02 PM

## 2024-01-22 ENCOUNTER — Other Ambulatory Visit (HOSPITAL_COMMUNITY)
Admission: RE | Admit: 2024-01-22 | Discharge: 2024-01-22 | Disposition: A | Discharge status: Home / Self Care | Source: Ambulatory Visit | Attending: Obstetrics and Gynecology | Admitting: Obstetrics and Gynecology

## 2024-01-22 ENCOUNTER — Encounter: Payer: Self-pay | Admitting: Obstetrics and Gynecology

## 2024-01-22 ENCOUNTER — Ambulatory Visit: Admitting: Obstetrics and Gynecology

## 2024-01-22 VITALS — BP 97/63 | HR 61 | Ht 64.0 in | Wt 122.0 lb

## 2024-01-22 DIAGNOSIS — Z01419 Encounter for gynecological examination (general) (routine) without abnormal findings: Secondary | ICD-10-CM

## 2024-01-26 LAB — CYTOLOGY - PAP
Comment: NEGATIVE
Diagnosis: NEGATIVE
High risk HPV: NEGATIVE

## 2024-01-27 ENCOUNTER — Ambulatory Visit: Payer: Self-pay | Admitting: Obstetrics and Gynecology

## 2024-02-16 DIAGNOSIS — L03115 Cellulitis of right lower limb: Secondary | ICD-10-CM | POA: Diagnosis not present
